# Patient Record
Sex: Female | Born: 1995 | Hispanic: Yes | Marital: Single | Smoking: Never smoker
Health system: Southern US, Community
[De-identification: ages and names within clinical notes are randomized; demographics above are authoritative.]

## PROBLEM LIST (undated history)

## (undated) ENCOUNTER — Inpatient Hospital Stay (HOSPITAL_COMMUNITY): Payer: Self-pay

## (undated) DIAGNOSIS — Z789 Other specified health status: Secondary | ICD-10-CM

## (undated) DIAGNOSIS — F1910 Other psychoactive substance abuse, uncomplicated: Secondary | ICD-10-CM

## (undated) DIAGNOSIS — F329 Major depressive disorder, single episode, unspecified: Secondary | ICD-10-CM

## (undated) DIAGNOSIS — F32A Depression, unspecified: Secondary | ICD-10-CM

## (undated) HISTORY — PX: NO PAST SURGERIES: SHX2092

---
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
## 2016-01-08 ENCOUNTER — Inpatient Hospital Stay (HOSPITAL_COMMUNITY)
Admission: AD | Admit: 2016-01-08 | Discharge: 2016-01-09 | Disposition: A | Discharge status: Home / Self Care | Payer: Self-pay | Source: Ambulatory Visit | Attending: Obstetrics & Gynecology | Admitting: Obstetrics & Gynecology

## 2016-01-08 ENCOUNTER — Encounter (HOSPITAL_COMMUNITY): Payer: Self-pay

## 2016-01-08 DIAGNOSIS — O219 Vomiting of pregnancy, unspecified: Secondary | ICD-10-CM | POA: Insufficient documentation

## 2016-01-08 DIAGNOSIS — Z3A00 Weeks of gestation of pregnancy not specified: Secondary | ICD-10-CM | POA: Insufficient documentation

## 2016-01-08 DIAGNOSIS — Z87891 Personal history of nicotine dependence: Secondary | ICD-10-CM | POA: Insufficient documentation

## 2016-01-08 LAB — URINALYSIS, ROUTINE W REFLEX MICROSCOPIC
Bilirubin Urine: NEGATIVE
Glucose, UA: NEGATIVE mg/dL
Hgb urine dipstick: NEGATIVE
Ketones, ur: 15 mg/dL — AB
LEUKOCYTES UA: NEGATIVE
NITRITE: NEGATIVE
Protein, ur: NEGATIVE mg/dL
SPECIFIC GRAVITY, URINE: 1.02 (ref 1.005–1.030)
pH: 6.5 (ref 5.0–8.0)

## 2016-01-08 LAB — CBC WITH DIFFERENTIAL/PLATELET
Basophils Absolute: 0 10*3/uL (ref 0.0–0.1)
Basophils Relative: 0 %
EOS ABS: 0 10*3/uL (ref 0.0–0.7)
EOS PCT: 0 %
HCT: 37.6 % (ref 36.0–46.0)
HEMOGLOBIN: 13.4 g/dL (ref 12.0–15.0)
LYMPHS ABS: 2.8 10*3/uL (ref 0.7–4.0)
LYMPHS PCT: 20 %
MCH: 32.4 pg (ref 26.0–34.0)
MCHC: 35.6 g/dL (ref 30.0–36.0)
MCV: 90.8 fL (ref 78.0–100.0)
MONOS PCT: 4 %
Monocytes Absolute: 0.5 10*3/uL (ref 0.1–1.0)
NEUTROS PCT: 76 %
Neutro Abs: 10.6 10*3/uL — ABNORMAL HIGH (ref 1.7–7.7)
Platelets: 396 10*3/uL (ref 150–400)
RBC: 4.14 MIL/uL (ref 3.87–5.11)
RDW: 12.3 % (ref 11.5–15.5)
WBC: 14 10*3/uL — ABNORMAL HIGH (ref 4.0–10.5)

## 2016-01-08 LAB — COMPREHENSIVE METABOLIC PANEL
ALT: 23 U/L (ref 14–54)
AST: 24 U/L (ref 15–41)
Albumin: 4.7 g/dL (ref 3.5–5.0)
Alkaline Phosphatase: 63 U/L (ref 38–126)
Anion gap: 10 (ref 5–15)
BUN: 7 mg/dL (ref 6–20)
CHLORIDE: 102 mmol/L (ref 101–111)
CO2: 23 mmol/L (ref 22–32)
CREATININE: 0.48 mg/dL (ref 0.44–1.00)
Calcium: 9.9 mg/dL (ref 8.9–10.3)
GFR calc Af Amer: >60 mL/min (ref >60)
GFR calc non Af Amer: >60 mL/min (ref >60)
Glucose, Bld: 91 mg/dL (ref 65–99)
Potassium: 3.6 mmol/L (ref 3.5–5.1)
SODIUM: 135 mmol/L (ref 135–145)
Total Bilirubin: 0.4 mg/dL (ref 0.3–1.2)
Total Protein: 8.2 g/dL — ABNORMAL HIGH (ref 6.5–8.1)

## 2016-01-08 LAB — LIPASE, BLOOD: LIPASE: 18 U/L (ref 11–51)

## 2016-01-08 LAB — POCT PREGNANCY, URINE: PREG TEST UR: POSITIVE — AB

## 2016-01-08 MED ORDER — PROCHLORPERAZINE EDISYLATE 5 MG/ML IJ SOLN
10.0000 mg | Freq: Once | INTRAMUSCULAR | Status: AC
  Administered 2016-01-09: 10 mg via INTRAVENOUS
  Filled 2016-01-08: qty 2

## 2016-01-08 MED ORDER — LACTATED RINGERS IV BOLUS (SEPSIS)
1000.0000 mL | Freq: Once | INTRAVENOUS | Status: AC
  Administered 2016-01-09: 1000 mL via INTRAVENOUS

## 2016-01-08 MED ORDER — PROMETHAZINE HCL 25 MG PO TABS
25.0000 mg | ORAL_TABLET | Freq: Once | ORAL | Status: AC
  Administered 2016-01-08: 25 mg via ORAL
  Filled 2016-01-08: qty 1

## 2016-01-08 NOTE — MAU Note (Signed)
N/V for 2 wks. Shortness of breath today. "I see everything dark" and then I have trouble breathing. HAve cold sweats when that happens. Some white vag d/c. No bleeding.

## 2016-01-08 NOTE — MAU Provider Note (Signed)
Chief Complaint: Nausea; Emesis During Pregnancy; and Shortness of Breath   SUBJECTIVE HPI: Norma Hardin is a 20 y.o. G2P0020 at Unknown who presents to [facility] reporting nausea, vomiting, short of breath, near syncope.  Symptoms began 2 weeks ago with nausea and vomiting. 4 times/day vomiting, every time she eats she is vomiting. She will also wake up in the night to vomit twice.  Unable to eat or drink for past two weeks, even water. Denies blood in vomit. +stooling. +urinating. Unsure if losing weight. Has tried The [employer]-WilliamsEmetrol OTC which only helped her fall sleep, but did not help with the nausea/vomiting.  Occasional stomach pain/cramping but not now. Denies vaginal bleeding or discharge.  LMP: sometime in October. But took a pregnancy test before which showed positive.       Past Medical History:  Diagnosis Date  . Medical history non-contributory    OB History  Gravida Para Term Preterm AB Living  2       2 0  SAB TAB Ectopic Multiple Live Births  2            # Outcome Date GA Lbr Len/2nd Weight Sex Delivery Anes PTL Lv  2 SAB           1 SAB              Past Surgical History:  Procedure Laterality Date  . NO PAST SURGERIES     Social History   Social History  . Marital status: Single    Spouse name: N/A  . Number of children: N/A  . Years of education: N/A   Occupational History  . Not on file.   Social History Main Topics  . Smoking status: Former Smoker    Quit date: 12/12/2013  . Smokeless tobacco: Never Used  . Alcohol use 0.6 oz/week    1 Cans of beer per week     Comment: Stopped drinking 1 months ago. Patient drank   . Drug use: No     Comment: Patient quit 3 months ago. Has taken several times.    NONE SINCE 6 MTHS  . Sexual activity: Yes    Birth control/ protection: None   Other Topics Concern  . Not on file   Social History Narrative  . No narrative on file   No current facility-administered medications on file prior to  encounter.    Current Outpatient Prescriptions on File Prior to Encounter  Medication Sig Dispense Refill  . cephALEXin (KEFLEX) 500 MG capsule Take 1 capsule (500 mg total) by mouth 4 (four) times daily. (Patient not taking: Reported on 07/14/2015) 28 capsule 0  . folic acid (FOLVITE) 1 MG tablet Take 1 mg by mouth daily.    Marland Kitchen. ibuprofen (ADVIL,MOTRIN) 600 MG tablet Take 1 tablet (600 mg total) by mouth every 6 (six) hours as needed. 30 tablet 0  . misoprostol (CYTOTEC) 200 MCG tablet Take 3 tablets (600 mcg total) by mouth once. Place medication in the side of your cheeks and let it dissolve 3 tablet 0  . oxyCODONE-acetaminophen (PERCOCET/ROXICET) 5-325 MG tablet Take 1-2 tablets by mouth every 6 (six) hours as needed. (Patient not taking: Reported on 07/14/2015) 10 tablet 0  . oxyCODONE-acetaminophen (PERCOCET/ROXICET) 5-325 MG tablet Take 1-2 tablets by mouth every 4 (four) hours as needed for severe pain. 10 tablet 0  . Prenatal Vit-Fe Fumarate-FA (PRENATAL MULTIVITAMIN) TABS tablet Take 1 tablet by mouth daily at 12 noon.     No Known Allergies  I have reviewed the past Medical Hx, Surgical Hx, Social Hx, Allergies and Medications.   REVIEW OF SYSTEMS  A comprehensive ROS was negative except per HPI.    OBJECTIVE Patient Vitals for the past 24 hrs:  BP Temp Pulse Resp SpO2 Height Weight  01/08/16 2157 (!) 120/45 98.3 F (36.8 C) 87 18 100 % 5\' 1"  (1.549 m) 129 lb 3.2 oz (58.6 kg)    PHYSICAL EXAM Constitutional: Well-developed, well-nourished female in no acute distress.  Cardiovascular: normal rate, rhythm, no murmurs Respiratory: normal rate and effort, CTAB  GI: Abd soft, non-tender, non-distended. Pos BS x 4 MS: Extremities nontender, no edema, normal ROM Neurologic: Alert and oriented x 4.  GU: Neg CVAT.  Bedside US performed, confirmed IUP. Limited by early gestation could not perform dating, deferred to outpatient OB visit.   LAB RESULTS Results for orders placed or  performed during the hospital encounter of 01/08/16 (from the past 24 hour(s))  Pregnancy, urine POC     Status: Abnormal   Collection Time: 01/08/16 10:33 PM  Result Value Ref Range   Preg Test, Ur POSITIVE (A) NEGATIVE    IMAGING No results found.  MAU COURSE IVF Phenergan/Compazine Labs - WNL UA - WNL PO trial - passed Bedside US confirmed IUP, limited by early gestational age  MDM Plan of care reviewed with patient, including labs and tests ordered and medical treatment.   ASSESSMENT 1. Nausea/vomiting in pregnancy     PLAN Discharge home in stable condition. Bleeding precautions given.  Medications for N/V sent to pharmacy Follow up with OB provider soon      Medication List    STOP taking these medications   cephALEXin 500 MG capsule Commonly known as:  KEFLEX   ibuprofen 600 MG tablet Commonly known as:  ADVIL,MOTRIN   misoprostol 200 MCG tablet Commonly known as:  CYTOTEC   oxyCODONE-acetaminophen 5-325 MG tablet Commonly known as:  PERCOCET/ROXICET     TAKE these medications   folic acid 1 MG tablet Commonly known as:  FOLVITE Take 1 mg by mouth daily.   prenatal multivitamin Tabs tablet Take 1 tablet by mouth daily at 12 noon.   promethazine 12.5 MG tablet Commonly known as:  PHENERGAN Take 1 tablet (12.5 mg total) by mouth every 6 (six) hours as needed for nausea or vomiting. Can take two tablets if needed.        Jen MowElizabeth Mumaw, DO OB Fellow 01/08/2016 10:38 PM

## 2016-01-09 DIAGNOSIS — O219 Vomiting of pregnancy, unspecified: Secondary | ICD-10-CM

## 2016-01-09 LAB — HCG, QUANTITATIVE, PREGNANCY: hCG, Beta Chain, Quant, S: 101979 m[IU]/mL — ABNORMAL HIGH (ref <5)

## 2016-01-09 MED ORDER — PROMETHAZINE HCL 12.5 MG PO TABS: 12.5000 mg | ORAL_TABLET | Freq: Four times a day (QID) | ORAL | 0 refills | Status: DC | PRN

## 2016-01-09 NOTE — Discharge Instructions (Signed)
Primer trimestre de embarazo °(First Trimester of Pregnancy) °El primer trimestre de embarazo se extiende desde la semana 1 hasta el final de la semana 12 (mes 1 al mes 3). Durante este tiempo, el bebé comenzará a desarrollarse dentro suyo. Entre la semana 6 y la 8, se forman los ojos y el rostro, y los latidos del corazón pueden verse en la ecografía. Al final de las 12 semanas, todos los órganos del bebé están formados. La atención prenatal es toda la asistencia médica que usted recibe antes del nacimiento del bebé. Asegúrese de recibir una buena atención prenatal y de seguir todas las indicaciones del médico. °CUIDADOS EN EL HOGAR °Medicamentos: °· Tome los medicamentos solamente como se lo haya indicado el médico. Algunos medicamentos se pueden tomar durante el embarazo y otros no. °· Tome las vitaminas prenatales como se lo haya indicado el médico. °· Tome el medicamento que la ayuda a defecar (laxante suave) según sea necesario, si el médico lo autoriza. °Dieta °· Ingiera alimentos saludables de manera regular. °· El médico le indicará la cantidad de peso que puede aumentar. °· No coma carne cruda ni quesos sin cocinar. °· Si tiene malestar estomacal (náuseas) o vomita: °? Ingiera 4 o 5 comidas pequeñas por día en lugar de 3 abundantes. °? Intente comer algunas galletitas saladas. °? Beba líquidos entre las comidas, en lugar de hacerlo durante estas. °· Si tiene dificultad para defecar (estreñimiento): °? Consuma alimentos con alto contenido de fibra, como verduras y frutas frescos, y cereales integrales. °? Beba suficiente líquido para mantener el pis (orina) claro o de color amarillo pálido. °Actividad y ejercicios °· Haga ejercicios solamente como se lo haya indicado el médico. Deje de hacer ejercicios si tiene cólicos o dolor en la parte baja del vientre (abdomen) o en la cintura. °· Intente no estar de pie durante mucho tiempo. Mueva las piernas con frecuencia si debe estar de pie en un lugar durante  mucho tiempo. °· Evite levantar pesos excesivos. °· Use zapatos con tacones bajos. Mantenga una buena postura al sentarse y pararse. °· Puede tener relaciones sexuales, a menos que el médico le indique lo contrario. °Alivio del dolor o las molestias °· Use un sostén que le brinde buen soporte si le duelen las mamas. °· Dese baños con agua tibia (baños de asiento) para aliviar el dolor o las molestias a causa de las hemorroides. Use crema antihemorroidal si el médico se lo permite. °· Descanse con las piernas elevadas si tiene calambres o dolor de cintura. °· Use medias de descanso si tiene las venas de las piernas hinchadas y abultadas (venas varicosas). Eleve los pies durante 15 minutos, 3 o 4 veces por día. Limite la cantidad de sal en su dieta. °Cuidados prenatales °· Programe las visitas prenatales para la semana 12 de embarazo. °· Escriba sus preguntas. Llévelas cuando concurra a las visitas prenatales. °· Concurra a todas las visitas prenatales como se lo haya indicado el médico. °Seguridad °· Colóquese el cinturón de seguridad cuando conduzca. °· Haga una lista con los números de teléfono en caso de emergencia, en la cual deben incluirse los números de los familiares, los amigos, el hospital y los departamentos de policía y de bomberos. °Consejos generales °· Pídale al médico que la derive a clases prenatales en su localidad. Debe comenzar a tomar las clases antes de entrar en el mes 6 de embarazo. °· Pida ayuda si necesita asesoramiento o asistencia con la alimentación. El médico puede aconsejarla o indicarle dónde recurrir para recibir ayuda. °· No se dé baños de inmersión en agua caliente, baños   turcos ni saunas. °· No se haga duchas vaginales ni use tampones o toallas higiénicas perfumadas. °· No mantenga las piernas cruzadas durante mucho tiempo. °· Evite el contacto con las bandejas sanitarias de los gatos y la tierra que estos animales usan. °· No fume, no consuma hierbas ni beba alcohol. No tome  fármacos que el médico no haya autorizado. °· No consuma ningún producto que contenga tabaco, lo que incluye cigarrillos, tabaco de mascar o cigarrillos electrónicos. Si necesita ayuda para dejar de fumar, consulte al médico. Puede recibir asesoramiento u otro tipo de apoyo para dejar de fumar. °· Visite al dentista. En su casa, lávese los dientes con un cepillo dental suave. Pásese el hilo dental con suavidad. °SOLICITE AYUDA SI: °· Tiene mareos. °· Tiene cólicos leves o siente presión en la parte baja del vientre. °· Siente un dolor persistente en la zona del vientre. °· Sigue teniendo malestar estomacal, vomita o las heces son líquidas (diarrea). °· Observa una secreción, con mal olor que proviene de la vagina. °· Siente dolor al orinar. °· Tiene el rostro, las manos, las piernas o los tobillos más hinchados (inflamados). ° °SOLICITE AYUDA DE INMEDIATO SI: °· Tiene fiebre. °· Tiene una pérdida de líquido por la vagina. °· Tiene sangrado o pequeñas pérdidas vaginales. °· Tiene cólicos o dolor muy intensos en el vientre. °· Sube o baja de peso rápidamente. °· Vomita sangre. Puede ser similar a la borra del café °· Está en contacto con personas que tienen rubéola, la quinta enfermedad o varicela. °· Siente un dolor de cabeza muy intenso. °· Le falta el aire. °· Sufre cualquier tipo de traumatismo, por ejemplo, debido a una caída o un accidente automovilístico. ° °Esta información no tiene como fin reemplazar el consejo del médico. Asegúrese de hacerle al médico cualquier pregunta que tenga. °Document Released: 04/29/2008 Document Revised: 02/21/2014 Document Reviewed: 12/11/2012 °Elsevier Interactive Patient Education © 2017 [employer] ° °

## 2016-01-31 ENCOUNTER — Encounter (HOSPITAL_COMMUNITY): Payer: Self-pay | Admitting: *Deleted

## 2016-01-31 ENCOUNTER — Inpatient Hospital Stay (HOSPITAL_COMMUNITY): Payer: Self-pay

## 2016-01-31 ENCOUNTER — Inpatient Hospital Stay (HOSPITAL_COMMUNITY)
Admission: AD | Admit: 2016-01-31 | Discharge: 2016-01-31 | Disposition: A | Discharge status: Home / Self Care | Payer: Self-pay | Source: Ambulatory Visit | Attending: Obstetrics and Gynecology | Admitting: Obstetrics and Gynecology

## 2016-01-31 DIAGNOSIS — B373 Candidiasis of vulva and vagina: Secondary | ICD-10-CM | POA: Insufficient documentation

## 2016-01-31 DIAGNOSIS — Z3A12 12 weeks gestation of pregnancy: Secondary | ICD-10-CM | POA: Insufficient documentation

## 2016-01-31 DIAGNOSIS — O23591 Infection of other part of genital tract in pregnancy, first trimester: Secondary | ICD-10-CM | POA: Insufficient documentation

## 2016-01-31 DIAGNOSIS — Z87891 Personal history of nicotine dependence: Secondary | ICD-10-CM | POA: Insufficient documentation

## 2016-01-31 DIAGNOSIS — R109 Unspecified abdominal pain: Secondary | ICD-10-CM

## 2016-01-31 DIAGNOSIS — N760 Acute vaginitis: Secondary | ICD-10-CM

## 2016-01-31 DIAGNOSIS — O98811 Other maternal infectious and parasitic diseases complicating pregnancy, first trimester: Secondary | ICD-10-CM | POA: Insufficient documentation

## 2016-01-31 DIAGNOSIS — B9689 Other specified bacterial agents as the cause of diseases classified elsewhere: Secondary | ICD-10-CM | POA: Insufficient documentation

## 2016-01-31 DIAGNOSIS — O26899 Other specified pregnancy related conditions, unspecified trimester: Secondary | ICD-10-CM

## 2016-01-31 DIAGNOSIS — B379 Candidiasis, unspecified: Secondary | ICD-10-CM

## 2016-01-31 LAB — CBC
HEMATOCRIT: 32.3 % — AB (ref 36.0–46.0)
Hemoglobin: 11.4 g/dL — ABNORMAL LOW (ref 12.0–15.0)
MCH: 32.2 pg (ref 26.0–34.0)
MCHC: 35.3 g/dL (ref 30.0–36.0)
MCV: 91.2 fL (ref 78.0–100.0)
Platelets: 368 10*3/uL (ref 150–400)
RBC: 3.54 MIL/uL — AB (ref 3.87–5.11)
RDW: 12.6 % (ref 11.5–15.5)
WBC: 12 10*3/uL — AB (ref 4.0–10.5)

## 2016-01-31 LAB — URINALYSIS, ROUTINE W REFLEX MICROSCOPIC
BILIRUBIN URINE: NEGATIVE
GLUCOSE, UA: NEGATIVE mg/dL
Hgb urine dipstick: NEGATIVE
Ketones, ur: NEGATIVE mg/dL
Leukocytes, UA: NEGATIVE
NITRITE: NEGATIVE
PH: 7 (ref 5.0–8.0)
Protein, ur: NEGATIVE mg/dL
SPECIFIC GRAVITY, URINE: 1.018 (ref 1.005–1.030)

## 2016-01-31 LAB — WET PREP, GENITAL
Sperm: NONE SEEN
Trich, Wet Prep: NONE SEEN

## 2016-01-31 LAB — HCG, QUANTITATIVE, PREGNANCY: hCG, Beta Chain, Quant, S: 125931 m[IU]/mL — ABNORMAL HIGH (ref <5)

## 2016-01-31 MED ORDER — METRONIDAZOLE 500 MG PO TABS: 500.0000 mg | ORAL_TABLET | Freq: Three times a day (TID) | ORAL | 0 refills | Status: AC

## 2016-01-31 MED ORDER — TERCONAZOLE 0.4 % VA CREA: 1.0000 | TOPICAL_CREAM | Freq: Every day | VAGINAL | 0 refills | Status: DC

## 2016-01-31 NOTE — MAU Provider Note (Signed)
History   Patient is a 20 year old G3P0020 at 12 weeks here with complaints of abdominal pain that started this morning.   CSN: [id number]  Arrival date and time: 01/31/16 1635   First Provider Initiated Contact with Patient 01/31/16 1929      Chief Complaint  Patient presents with  . Abdominal Pain   Abdominal Pain  This is a new problem. The current episode started today. The onset quality is sudden. The problem occurs intermittently. The problem has been unchanged. The pain is located in the suprapubic region. The pain is at a severity of 4/10. The pain is moderate. The quality of the pain is cramping. The abdominal pain does not radiate. Pertinent negatives include no anorexia, arthralgias, belching, constipation, diarrhea, dysuria, fever, flatus, frequency, headaches, hematochezia, hematuria, melena, myalgias, nausea, vomiting or weight loss. Nothing aggravates the pain. The pain is relieved by nothing. She has tried nothing for the symptoms.    OB History    Gravida Para Term Preterm AB Living   3       2 0   SAB TAB Ectopic Multiple Live Births   2              Past Medical History:  Diagnosis Date  . Medical history non-contributory     Past Surgical History:  Procedure Laterality Date  . NO PAST SURGERIES      No family history on file.  Social History  Substance Use Topics  . Smoking status: Former Smoker    Quit date: 12/12/2013  . Smokeless tobacco: Never Used  . Alcohol use 0.6 oz/week    1 Cans of beer per week     Comment: Stopped drinking 1 months ago. Patient drank     Allergies: No Known Allergies  Prescriptions Prior to Admission  Medication Sig Dispense Refill Last Dose  . folic acid (FOLVITE) 1 MG tablet Take 1 mg by mouth daily.   07/09/2015  . Prenatal Vit-Fe Fumarate-FA (PRENATAL MULTIVITAMIN) TABS tablet Take 1 tablet by mouth daily at 12 noon.   07/29/2015 at Unknown time  . promethazine (PHENERGAN) 12.5 MG tablet Take 1 tablet (12.5 mg  total) by mouth every 6 (six) hours as needed for nausea or vomiting. Can take two tablets if needed. 30 tablet 0     Review of Systems  Constitutional: Negative for fever and weight loss.  HENT: Negative.   Eyes: Negative.   Respiratory: Negative.   Cardiovascular: Negative.   Gastrointestinal: Positive for abdominal pain. Negative for anorexia, constipation, diarrhea, flatus, hematochezia, melena, nausea and vomiting.  Genitourinary: Negative for dysuria, frequency and hematuria.  Musculoskeletal: Negative for arthralgias and myalgias.  Skin: Negative.   Neurological: Negative.  Negative for headaches.  Endo/Heme/Allergies: Negative.   Psychiatric/Behavioral: Negative.    Physical Exam   Blood pressure 108/64, pulse 86, temperature 98.3 F (36.8 C), temperature source Oral, resp. rate 16, weight 58 kg (127 lb 12.8 oz), last menstrual period 11/26/2015, unknown if currently breastfeeding.  Physical Exam  Constitutional: She appears well-developed and well-nourished.  HENT:  Head: Normocephalic.  Eyes: Pupils are equal, round, and reactive to light.  Neck: Normal range of motion.  Respiratory: Effort normal. No respiratory distress. She has no wheezes. She has no rales. She exhibits no tenderness.  GI: Soft. Bowel sounds are normal. She exhibits no distension and no mass. There is no tenderness. There is no rebound and no guarding.  Genitourinary: Vagina normal.  Genitourinary Comments: External labia normal, no  lesions. Speculum exam reveals erythematous vaginal walls with adherant clumpy white discharge. Cervix is pink with no lesions. No CMT.     MAU Course  Procedures  MDM -bimanual and spec -wet prep: yeast and clue cells present -GC CT pending -beta is 125,000 -US-normal IUP at 12 weeks  Assessment and Plan  1. G3 P0020 at 12 weeks with bacterial vaginosis and yeast infection.  2. Discharge home with Flagyl and terazol; medication instructions given.  3. Patient  will follow up with the health department to begin her prenatal care.   Luna KitchensKathryn Neriyah Cercone CNM  Charlesetta GaribaldiKathryn Lorraine Juanita Streight 01/31/2016, 7:36 PM

## 2016-01-31 NOTE — Discharge Instructions (Signed)
Candidiasis vaginal en los adultos (Gastrointestinal Yeast Infection, Adult) La candidiasis vaginal es una afeccin que causa dolor, hinchazn y enrojecimiento (inflamacin) de la vagina. Tambin causa secrecin vaginal. Esta es una enfermedad frecuente. Algunas mujeres contraen esta infeccin con frecuencia. CAUSAS La causa de la infeccin es un cambio en el equilibrio normal de los hongos (cndida) y las bacterias que viven en la vagina. Esta alteracin deriva en el crecimiento excesivo de los hongos, lo que causa la inflamacin. FACTORES DE RIESGO Es ms probable que esta afeccin se manifieste en:  Las mujeres que toman antibiticos.  Las mujeres que tienen diabetes.  Las mujeres que toman anticonceptivos.  Las mujeres que estn embarazadas.  Las mujeres que se hacen duchas vaginales con frecuencia.  Las mujeres que tienen un sistema de defensa (inmunitario) dbil.  Las mujeres que han tomado corticoides durante mucho tiempo.  Las mujeres que usan ropa ajustada con frecuencia. SNTOMAS Los sntomas de esta afeccin incluyen lo siguiente:  Secrecin vaginal blanca y espesa.  Hinchazn, picazn, enrojecimiento e irritacin de la vagina. Los labios de la vagina (vulva) tambin se pueden infectar.  Dolor o ardor al Geographical information systems officerorinar.  Dolor durante las The St. Paul Travelersrelaciones sexuales. DIAGNSTICO Esta afeccin se diagnostica mediante la historia clnica y un examen fsico. Este incluye un examen plvico. El mdico examinar una muestra de la secrecin vaginal con un microscopio. Probablemente el mdico enve esta muestra al laboratorio para analizarla y confirmar el diagnstico. TRATAMIENTO Esta afeccin se trata con medicamentos. Los United Parcelmedicamentos pueden ser recetados o de [address] lib[address]. Podrn indicarle que use uno o ms de lo siguiente:  Medicamentos por va oral.  Medicamentos que se aplican como una crema.  Medicamentos que se colocan directamente en la vagina (vulos vaginales). INSTRUCCIONES  PARA EL CUIDADO EN EL HOGAR  Tome o aplquese los medicamentos de venta libre y Building control surveyorrecetados solamente como se lo haya indicado el mdico.  No tenga relaciones sexuales hasta que el mdico lo autorice. Comunique a su compaero sexual que tiene una infeccin por hongos. Esas personas deben consultar al mdico si tienen sntomas.  No use ropa ajustada, como pantis o pantalones ajustados.  Evite el uso de tampones hasta que el mdico lo autorice.  Consuma ms yogur. Esto puede ayudar a Artistevitar la recurrencia de la candidiasis.  Intente darse un bao de asiento para Altria Groupaliviar las molestias. Se trata de un bao de agua tibia que se toma mientras se est sentado. El agua solo debe Adult nursellegar hasta las caderas y cubrir las nalgas. Hgalo 3o 4veces al da o como se lo haya indicado el mdico.  No se haga duchas vaginales.  Use ropa interior transpirable de algodn.  Si tiene diabetes, mantenga bajo control los niveles de Bankerazcar en la sangre. SOLICITE ATENCIN MDICA SI:  Lance Mussiene fiebre.  Los sntomas desaparecen y Stage managerluego reaparecen.  Los sntomas no mejoran con Scientist, research (medical)el tratamiento.  Los sntomas empeoran.  Aparecen nuevos sntomas.  Aparecen ampollas alrededor o adentro de la vagina.  Le sale sangre de la vagina y no est menstruando.  Siente dolor en el abdomen. Esta informacin no tiene Theme park managercomo fin reemplazar el consejo del mdico. Asegrese de hacerle al mdico cualquier pregunta que tenga. Document Released: 11/10/2004 Document Revised: 05/25/2015 Document Reviewed: 08/04/2014 Elsevier Interactive Patient Education  2017 Elsevier Inc. Vaginosis bacteriana (Bacterial Vaginosis) La vaginosis bacteriana es una infeccin de la vagina. Se produce cuando crece una cantidad excesiva de grmenes normales (bacterias sanas) en la vagina. Esta infeccin aumenta el riesgo de contraer otras infecciones de  transmisin sexual. El tratamiento de esta infeccin puede ayudar a reducir el riesgo de otras  infecciones, como:  Clamidia.  Bettey MareGonorrea.  VIH.  Herpes. CUIDADOS EN EL HOGAR  Tome los medicamentos tal como se lo indic su mdico.  Finalice la prescripcin completa, aunque comience a sentirse mejor.  Comunique a sus compaeros sexuales que sufre una infeccin. Deben consultar a su mdico para iniciar un tratamiento.  Durante el tratamiento:  Soil scientistvite mantener relaciones sexuales o use preservativos de Network engineerla forma correcta.  No se haga duchas vaginales.  No consuma alcohol a menos que el mdico lo autorice.  No amamante a menos que el mdico la autorice. SOLICITE AYUDA SI:  No mejora luego de 3 das de [place] Janettratamiento.  Observa una secrecin (prdida) de color gris ms abundante que proviene de la vagina.  Siente ms dolor que antes.  Tiene fiebre. ASEGRESE DE QUE:  Comprende estas instrucciones.  Controlar su afeccin.  Recibir ayuda de inmediato si no mejora o si empeora. Esta informacin no tiene Theme park managercomo fin reemplazar el consejo del mdico. Asegrese de hacerle al mdico cualquier pregunta que tenga. Document Released: 04/29/2008 Document Revised: 05/25/2015 Document Reviewed: 09/12/2012 Elsevier Interactive Patient Education  2017 [employer] Inc.

## 2016-01-31 NOTE — MAU Note (Signed)
Pain in lower, started this morning.  Comes and goes, like period cramping

## 2016-02-01 LAB — HIV ANTIBODY (ROUTINE TESTING W REFLEX): HIV SCREEN 4TH GENERATION: NONREACTIVE

## 2016-02-01 LAB — GC/CHLAMYDIA PROBE AMP (~~LOC~~) NOT AT ARMC
Chlamydia: NEGATIVE
NEISSERIA GONORRHEA: NEGATIVE

## 2016-02-15 NOTE — L&D Delivery Note (Signed)
Delivery Note At 10:01 PM a viable female was delivered via Vaginal, Spontaneous Delivery (Presentation: OA).  Delayed cord clamping performed. APGAR: 9, 9; weight  pending. Placenta status: delivered, intact. Cord: 3V with the following complications: none. Cord pH: n/a  Anesthesia: local  Episiotomy: None Lacerations: 2nd degree;Perineal Suture Repair: 2.0 vicryl rapide Est. Blood Loss (mL): 300  Mom to postpartum.  Baby to Couplet care / Skin to Skin.  Donette LarryMelanie Denisia Harpole, CNM 08/21/2016, 10:40 PM

## 2016-03-01 LAB — OB RESULTS CONSOLE ANTIBODY SCREEN: Antibody Screen: NEGATIVE

## 2016-03-01 LAB — OB RESULTS CONSOLE GC/CHLAMYDIA
Chlamydia: NEGATIVE
GC PROBE AMP, GENITAL: NEGATIVE

## 2016-03-01 LAB — OB RESULTS CONSOLE ABO/RH: RH Type: POSITIVE

## 2016-03-01 LAB — OB RESULTS CONSOLE HIV ANTIBODY (ROUTINE TESTING): HIV: NONREACTIVE

## 2016-03-01 LAB — OB RESULTS CONSOLE RUBELLA ANTIBODY, IGM: Rubella: IMMUNE

## 2016-03-01 LAB — OB RESULTS CONSOLE RPR: RPR: NONREACTIVE

## 2016-03-01 LAB — OB RESULTS CONSOLE HEPATITIS B SURFACE ANTIGEN: Hepatitis B Surface Ag: NEGATIVE

## 2016-03-27 ENCOUNTER — Inpatient Hospital Stay (HOSPITAL_COMMUNITY)
Admission: AD | Admit: 2016-03-27 | Discharge: 2016-03-27 | Disposition: A | Discharge status: Home / Self Care | Payer: Medicaid Other | Source: Ambulatory Visit | Attending: Obstetrics & Gynecology | Admitting: Obstetrics & Gynecology

## 2016-03-27 DIAGNOSIS — Z3A20 20 weeks gestation of pregnancy: Secondary | ICD-10-CM | POA: Insufficient documentation

## 2016-03-27 DIAGNOSIS — K117 Disturbances of salivary secretion: Secondary | ICD-10-CM | POA: Insufficient documentation

## 2016-03-27 DIAGNOSIS — O219 Vomiting of pregnancy, unspecified: Secondary | ICD-10-CM | POA: Insufficient documentation

## 2016-03-27 DIAGNOSIS — O26899 Other specified pregnancy related conditions, unspecified trimester: Secondary | ICD-10-CM

## 2016-03-27 DIAGNOSIS — B373 Candidiasis of vulva and vagina: Secondary | ICD-10-CM

## 2016-03-27 DIAGNOSIS — B3731 Acute candidiasis of vulva and vagina: Secondary | ICD-10-CM

## 2016-03-27 DIAGNOSIS — R109 Unspecified abdominal pain: Secondary | ICD-10-CM | POA: Diagnosis not present

## 2016-03-27 DIAGNOSIS — Z87891 Personal history of nicotine dependence: Secondary | ICD-10-CM | POA: Insufficient documentation

## 2016-03-27 DIAGNOSIS — O98812 Other maternal infectious and parasitic diseases complicating pregnancy, second trimester: Secondary | ICD-10-CM | POA: Insufficient documentation

## 2016-03-27 DIAGNOSIS — O26892 Other specified pregnancy related conditions, second trimester: Secondary | ICD-10-CM | POA: Insufficient documentation

## 2016-03-27 DIAGNOSIS — Z79899 Other long term (current) drug therapy: Secondary | ICD-10-CM | POA: Insufficient documentation

## 2016-03-27 LAB — URINALYSIS, ROUTINE W REFLEX MICROSCOPIC
Bilirubin Urine: NEGATIVE
Glucose, UA: NEGATIVE mg/dL
Hgb urine dipstick: NEGATIVE
Ketones, ur: NEGATIVE mg/dL
Leukocytes, UA: NEGATIVE
Nitrite: NEGATIVE
Protein, ur: NEGATIVE mg/dL
Specific Gravity, Urine: 1.01 (ref 1.005–1.030)
pH: 8 (ref 5.0–8.0)

## 2016-03-27 LAB — WET PREP, GENITAL
CLUE CELLS WET PREP: NONE SEEN
Sperm: NONE SEEN
TRICH WET PREP: NONE SEEN

## 2016-03-27 MED ORDER — TERCONAZOLE 0.4 % VA CREA: 1.0000 | TOPICAL_CREAM | Freq: Every day | VAGINAL | 0 refills | Status: DC

## 2016-03-27 MED ORDER — GLYCOPYRROLATE 1 MG PO TABS: 1.0000 mg | ORAL_TABLET | Freq: Three times a day (TID) | ORAL | 0 refills | Status: DC | PRN

## 2016-03-27 MED ORDER — ACETAMINOPHEN 500 MG PO TABS
1000.0000 mg | ORAL_TABLET | Freq: Four times a day (QID) | ORAL | Status: DC | PRN
  Administered 2016-03-27: 1000 mg via ORAL
  Filled 2016-03-27: qty 2

## 2016-03-27 NOTE — MAU Provider Note (Signed)
History     CSN: [id number]  Arrival date and time: 03/27/16 1739   First Provider Initiated Contact with Patient 03/27/16 1802    In-house interpreter present for encounter  Chief Complaint  Patient presents with  . Abdominal Pain   G3P0020 @20 .0 weeks here with constant lower abdominal cramping. She reports sx started 4 hrs ago. She rates the pain 8/10. She has not used anything for the pain. She also reports malodor from the vagina yesterday and thick yellow vaginal discharge today. No VB. No recent IC. She denies fevers. No urinary sx. She admits to poor hydration today. She reports she was in a verbal altercation with her boyfriend before the pain started. She denies physical abuse from partner, they live together, and feels safe in her home. She is receiving care at [facility]. She reports N/V about every other day for which she uses Phenergan, and excessive saliva and spitting all day long.    OB History    Gravida Para Term Preterm AB Living   3       2 0   SAB TAB Ectopic Multiple Live Births   2              Past Medical History:  Diagnosis Date  . Medical history non-contributory     Past Surgical History:  Procedure Laterality Date  . NO PAST SURGERIES      No family history on file.  Social History  Substance Use Topics  . Smoking status: Former Smoker    Quit date: 12/12/2013  . Smokeless tobacco: Never Used  . Alcohol use 0.6 oz/week    1 Cans of beer per week     Comment: Stopped drinking 1 months ago. Patient drank     Allergies: No Known Allergies  Prescriptions Prior to Admission  Medication Sig Dispense Refill Last Dose  . folic acid (FOLVITE) 1 MG tablet Take 1 mg by mouth daily.   07/09/2015  . Prenatal Vit-Fe Fumarate-FA (PRENATAL MULTIVITAMIN) TABS tablet Take 1 tablet by mouth daily at 12 noon.   07/29/2015 at Unknown time  . promethazine (PHENERGAN) 12.5 MG tablet Take 1 tablet (12.5 mg total) by mouth every 6 (six) hours as needed for nausea or  vomiting. Can take two tablets if needed. 30 tablet 0   . terconazole (TERAZOL 7) 0.4 % vaginal cream Place 1 applicator vaginally at bedtime. 45 g 0     Review of Systems  Constitutional: Negative for chills and fever.  Gastrointestinal: Positive for abdominal pain, nausea and vomiting.  Genitourinary: Positive for vaginal discharge. Negative for dysuria, hematuria, urgency and vaginal bleeding.   Physical Exam   Blood pressure 111/63, pulse 108, temperature 99.1 F (37.3 C), temperature source Oral, resp. rate 16, last menstrual period 11/26/2015, unknown if currently breastfeeding.  Physical Exam  Nursing note and vitals reviewed. Constitutional: She is oriented to person, place, and time. She appears well-developed and well-nourished. No distress (eyes appear red, suspect recent crying).  HENT:  Head: Normocephalic and atraumatic.  Neck: Normal range of motion.  Respiratory: Effort normal.  GI: Soft. She exhibits no distension and no mass. There is tenderness (suprapubic). There is no rebound and no guarding.  Genitourinary:  Genitourinary Comments: External: no lesions or erythema Vagina: rugated, nulli, copious thick drk yellow curdy discharge SVE: closed/long   Musculoskeletal: Normal range of motion.  Neurological: She is alert and oriented to person, place, and time.  Skin: Skin is warm and dry.  Psychiatric: She  has a normal mood and affect.   FHT: 153 bpm  Results for orders placed or performed during the hospital encounter of 03/27/16 (from the past 24 hour(s))  Urinalysis, Routine w reflex microscopic     Status: None   Collection Time: 03/27/16  5:50 PM  Result Value Ref Range   Color, Urine YELLOW YELLOW   APPearance CLEAR CLEAR   Specific Gravity, Urine 1.010 1.005 - 1.030   pH 8.0 5.0 - 8.0   Glucose, UA NEGATIVE NEGATIVE mg/dL   Hgb urine dipstick NEGATIVE NEGATIVE   Bilirubin Urine NEGATIVE NEGATIVE   Ketones, ur NEGATIVE NEGATIVE mg/dL   Protein, ur  NEGATIVE NEGATIVE mg/dL   Nitrite NEGATIVE NEGATIVE   Leukocytes, UA NEGATIVE NEGATIVE  Wet prep, genital     Status: Abnormal   Collection Time: 03/27/16  6:20 PM  Result Value Ref Range   Yeast Wet Prep HPF POC PRESENT (A) NONE SEEN   Trich, Wet Prep NONE SEEN NONE SEEN   Clue Cells Wet Prep HPF POC NONE SEEN NONE SEEN   WBC, Wet Prep HPF POC MANY (A) NONE SEEN   Sperm NONE SEEN     MAU Course  Procedures Tylenol 1 g po Po hydration  MDM Labs ordered and reviewed. Pain improved from 8 to 4. No evidence of UTI or threatened Ab. Will treat for yeast and ptyalism. Stable for discharge home.   Assessment and Plan   1. [redacted] weeks gestation of pregnancy   2. Abdominal cramping affecting pregnancy   3. Ptyalism   4. Yeast vaginitis    [facility] Rx Teradol Rx Robinul Increase water intake  Allergies as of 03/27/2016   No Known Allergies     Medication List    TAKE these medications   folic acid 1 MG tablet Commonly known as:  FOLVITE Take 1 mg by mouth daily.   glycopyrrolate 1 MG tablet Commonly known as:  ROBINUL Take 1 tablet (1 mg total) by mouth 3 (three) times daily as needed.   prenatal multivitamin Tabs tablet Take 1 tablet by mouth daily at 12 noon.   promethazine 12.5 MG tablet Commonly known as:  PHENERGAN Take 1 tablet (12.5 mg total) by mouth every 6 (six) hours as needed for nausea or vomiting. Can take two tablets if needed.   terconazole 0.4 % vaginal cream Commonly known as:  TERAZOL 7 Place 1 applicator vaginally at bedtime. What changed:  Another medication with the same name was added. Make sure you understand how and when to take each.   terconazole 0.4 % vaginal cream Commonly known as:  TERAZOL 7 Place 1 applicator vaginally at bedtime. What changed:  You were already taking a medication with the same name, and this prescription was added. Make sure you understand how and when to take each.       Donette LarryMelanie Lounell Schumacher, CNM 03/27/2016, 6:24 PM

## 2016-03-27 NOTE — MAU Note (Signed)
Patient presents with lower abdominal cramping which started this afternoon, got in an argument with her boyfriend and started crying, this started after that, denies vaginal bleeding, small amount of discomfort with urination.

## 2016-03-27 NOTE — Discharge Instructions (Signed)

## 2016-03-28 LAB — GC/CHLAMYDIA PROBE AMP (~~LOC~~) NOT AT ARMC
CHLAMYDIA, DNA PROBE: NEGATIVE
Neisseria Gonorrhea: NEGATIVE

## 2016-07-04 LAB — OB RESULTS CONSOLE GBS: STREP GROUP B AG: NEGATIVE

## 2016-08-16 ENCOUNTER — Telehealth (HOSPITAL_COMMUNITY): Payer: Self-pay | Admitting: *Deleted

## 2016-08-16 ENCOUNTER — Other Ambulatory Visit (HOSPITAL_COMMUNITY): Payer: Self-pay | Admitting: Nurse Practitioner

## 2016-08-16 DIAGNOSIS — O480 Post-term pregnancy: Secondary | ICD-10-CM

## 2016-08-16 NOTE — Telephone Encounter (Signed)
Preadmission screen [phone] interpreter number

## 2016-08-17 ENCOUNTER — Other Ambulatory Visit: Payer: Self-pay | Admitting: Advanced Practice Midwife

## 2016-08-19 ENCOUNTER — Ambulatory Visit (HOSPITAL_COMMUNITY)
Admission: RE | Admit: 2016-08-19 | Discharge: 2016-08-19 | Disposition: A | Discharge status: Home / Self Care | Payer: Self-pay | Source: Ambulatory Visit | Attending: Nurse Practitioner | Admitting: Nurse Practitioner

## 2016-08-19 DIAGNOSIS — Z3A40 40 weeks gestation of pregnancy: Secondary | ICD-10-CM | POA: Insufficient documentation

## 2016-08-19 DIAGNOSIS — O480 Post-term pregnancy: Secondary | ICD-10-CM | POA: Insufficient documentation

## 2016-08-21 ENCOUNTER — Encounter (HOSPITAL_COMMUNITY): Payer: Self-pay | Admitting: Anesthesiology

## 2016-08-21 ENCOUNTER — Inpatient Hospital Stay (HOSPITAL_COMMUNITY)
Admission: RE | Admit: 2016-08-21 | Discharge: 2016-08-23 | DRG: 775 | Disposition: A | Discharge status: Home / Self Care | Payer: Medicaid Other | Source: Ambulatory Visit | Attending: Obstetrics & Gynecology | Admitting: Obstetrics & Gynecology

## 2016-08-21 ENCOUNTER — Encounter (HOSPITAL_COMMUNITY): Payer: Self-pay

## 2016-08-21 VITALS — BP 102/62 | HR 80 | Temp 98.0°F | Resp 16 | Ht 61.0 in | Wt 166.0 lb

## 2016-08-21 DIAGNOSIS — Z87891 Personal history of nicotine dependence: Secondary | ICD-10-CM | POA: Diagnosis not present

## 2016-08-21 DIAGNOSIS — Z3A41 41 weeks gestation of pregnancy: Secondary | ICD-10-CM

## 2016-08-21 DIAGNOSIS — O480 Post-term pregnancy: Secondary | ICD-10-CM

## 2016-08-21 HISTORY — DX: Other psychoactive substance abuse, uncomplicated: F19.10

## 2016-08-21 HISTORY — DX: Major depressive disorder, single episode, unspecified: F32.9

## 2016-08-21 HISTORY — DX: Depression, unspecified: F32.A

## 2016-08-21 LAB — RPR: RPR Ser Ql: NONREACTIVE

## 2016-08-21 LAB — CBC
HEMATOCRIT: 38.2 % (ref 36.0–46.0)
Hemoglobin: 13 g/dL (ref 12.0–15.0)
MCH: 33.9 pg (ref 26.0–34.0)
MCHC: 34 g/dL (ref 30.0–36.0)
MCV: 99.5 fL (ref 78.0–100.0)
Platelets: 300 10*3/uL (ref 150–400)
RBC: 3.84 MIL/uL — ABNORMAL LOW (ref 3.87–5.11)
RDW: 14.5 % (ref 11.5–15.5)
WBC: 12.3 10*3/uL — AB (ref 4.0–10.5)

## 2016-08-21 LAB — RAPID URINE DRUG SCREEN, HOSP PERFORMED
Amphetamines: NOT DETECTED
BARBITURATES: NOT DETECTED
BENZODIAZEPINES: NOT DETECTED
COCAINE: NOT DETECTED
Opiates: NOT DETECTED
TETRAHYDROCANNABINOL: NOT DETECTED

## 2016-08-21 LAB — TYPE AND SCREEN
ABO/RH(D): A POS
ANTIBODY SCREEN: NEGATIVE

## 2016-08-21 MED ORDER — OXYCODONE-ACETAMINOPHEN 5-325 MG PO TABS: 2.0000 | ORAL_TABLET | ORAL | Status: DC | PRN

## 2016-08-21 MED ORDER — FENTANYL 2.5 MCG/ML BUPIVACAINE 1/10 % EPIDURAL INFUSION (WH - ANES)
INTRAMUSCULAR | Status: AC
  Filled 2016-08-21: qty 100

## 2016-08-21 MED ORDER — DIPHENHYDRAMINE HCL 50 MG/ML IJ SOLN: 12.5000 mg | INTRAMUSCULAR | Status: DC | PRN

## 2016-08-21 MED ORDER — OXYTOCIN 40 UNITS IN LACTATED RINGERS INFUSION - SIMPLE MED
2.5000 [IU]/h | INTRAVENOUS | Status: DC
  Filled 2016-08-21: qty 1000

## 2016-08-21 MED ORDER — MISOPROSTOL 200 MCG PO TABS
50.0000 ug | ORAL_TABLET | ORAL | Status: DC | PRN
  Administered 2016-08-21 (×2): 50 ug via ORAL
  Filled 2016-08-21 (×2): qty 1

## 2016-08-21 MED ORDER — LACTATED RINGERS IV SOLN
INTRAVENOUS | Status: DC
  Administered 2016-08-21 (×2): via INTRAVENOUS

## 2016-08-21 MED ORDER — LACTATED RINGERS IV SOLN: 500.0000 mL | INTRAVENOUS | Status: DC | PRN

## 2016-08-21 MED ORDER — EPHEDRINE 5 MG/ML INJ
10.0000 mg | INTRAVENOUS | Status: DC | PRN
  Filled 2016-08-21: qty 2

## 2016-08-21 MED ORDER — PHENYLEPHRINE 40 MCG/ML (10ML) SYRINGE FOR IV PUSH (FOR BLOOD PRESSURE SUPPORT)
80.0000 ug | PREFILLED_SYRINGE | INTRAVENOUS | Status: DC | PRN
  Filled 2016-08-21: qty 5

## 2016-08-21 MED ORDER — MISOPROSTOL 25 MCG QUARTER TABLET: 25.0000 ug | ORAL_TABLET | ORAL | Status: DC | PRN

## 2016-08-21 MED ORDER — LACTATED RINGERS IV SOLN
500.0000 mL | Freq: Once | INTRAVENOUS | Status: AC
  Administered 2016-08-21: 500 mL via INTRAVENOUS

## 2016-08-21 MED ORDER — OXYCODONE-ACETAMINOPHEN 5-325 MG PO TABS: 1.0000 | ORAL_TABLET | ORAL | Status: DC | PRN

## 2016-08-21 MED ORDER — FENTANYL 2.5 MCG/ML BUPIVACAINE 1/10 % EPIDURAL INFUSION (WH - ANES): 14.0000 mL/h | INTRAMUSCULAR | Status: DC | PRN

## 2016-08-21 MED ORDER — SOD CITRATE-CITRIC ACID 500-334 MG/5ML PO SOLN: 30.0000 mL | ORAL | Status: DC | PRN

## 2016-08-21 MED ORDER — TERBUTALINE SULFATE 1 MG/ML IJ SOLN
0.2500 mg | Freq: Once | INTRAMUSCULAR | Status: DC | PRN
  Filled 2016-08-21: qty 1

## 2016-08-21 MED ORDER — ACETAMINOPHEN 325 MG PO TABS: 650.0000 mg | ORAL_TABLET | ORAL | Status: DC | PRN

## 2016-08-21 MED ORDER — FENTANYL CITRATE (PF) 100 MCG/2ML IJ SOLN
100.0000 ug | INTRAMUSCULAR | Status: DC | PRN
  Administered 2016-08-21: 100 ug via INTRAVENOUS
  Filled 2016-08-21: qty 2

## 2016-08-21 MED ORDER — PHENYLEPHRINE 40 MCG/ML (10ML) SYRINGE FOR IV PUSH (FOR BLOOD PRESSURE SUPPORT)
PREFILLED_SYRINGE | INTRAVENOUS | Status: AC
  Filled 2016-08-21: qty 20

## 2016-08-21 MED ORDER — ONDANSETRON HCL 4 MG/2ML IJ SOLN: 4.0000 mg | Freq: Four times a day (QID) | INTRAMUSCULAR | Status: DC | PRN

## 2016-08-21 MED ORDER — OXYTOCIN BOLUS FROM INFUSION
500.0000 mL | Freq: Once | INTRAVENOUS | Status: AC
  Administered 2016-08-21: 500 mL via INTRAVENOUS

## 2016-08-21 MED ORDER — METHYLERGONOVINE MALEATE 0.2 MG/ML IJ SOLN
0.2000 mg | Freq: Once | INTRAMUSCULAR | Status: AC
  Administered 2016-08-21: 0.2 mg via INTRAMUSCULAR

## 2016-08-21 MED ORDER — LIDOCAINE HCL (PF) 1 % IJ SOLN
30.0000 mL | INTRAMUSCULAR | Status: DC | PRN
  Administered 2016-08-21: 30 mL via SUBCUTANEOUS
  Filled 2016-08-21: qty 30

## 2016-08-21 NOTE — Progress Notes (Signed)
Labor Progress Note Selenia CongoMontenegro Rosa is a 21 y.o. G3P0020 at 2329w0d presented for IOL for post dates  S:  Very uncomfortable w/ctx, recent dose IV pain meds, not helping.   O:  BP (!) 118/93   Pulse 93   Temp 98.5 F (36.9 C)   Resp 18   Ht 5\' 1"  (1.549 m)   Wt 75.3 kg (166 lb)   LMP 11/26/2015   BMI 31.37 kg/m  EFM: baseline 125 bpm/ mod variability/ + accels/ no decels  Toco: 1-2 SVE: Dilation: 7 Effacement (%): 100 Cervical Position: Posterior Station: 0 Presentation: Vertex Exam by:: Dalisa Forrer,cnm   A/P: 21 y.o. G3P0020 3229w0d  1. Labor: active 2. FWB: Cat I 3. Pain: requesting epidural Anticipate labor progression and SVD.  Donette LarryMelanie Asa Baudoin, CNM 9:18 PM

## 2016-08-21 NOTE — H&P (Signed)
LABOR ADMISSION HISTORY AND PHYSICAL  Norma Hardin is a 21 y.o. female G3P0020 with IUP at 8670w0d by US presenting for IOL for postdates. She reports +FMs, No LOF, no VB, no blurry vision, peripheral edema, and RUQ pain. She has a mild headache  She plans on breast feeding. She request Depo for birth control.  Dating: By [place] --->  Estimated Date of Delivery: 08/14/16   Prenatal History/Complications:  Past Medical History: Past Medical History:  Diagnosis Date  . Medical history non-contributory     Past Surgical History: Past Surgical History:  Procedure Laterality Date  . NO PAST SURGERIES      Obstetrical History: OB History    Gravida Para Term Preterm AB Living   3       2 0   SAB TAB Ectopic Multiple Live Births   2              Social History: Social History   Social History  . Marital status: Single    Spouse name: N/A  . Number of children: N/A  . Years of education: N/A   Social History Main Topics  . Smoking status: Former Smoker    Quit date: 12/12/2013  . Smokeless tobacco: Never Used  . Alcohol use 0.6 oz/week    1 Cans of beer per week     Comment: Stopped drinking 1 months ago. Patient drank   . Drug use: No     Comment: Patient quit 3 months ago. Has taken several times.    NONE SINCE 6 MTHS  . Sexual activity: Yes    Birth control/ protection: None   Other Topics Concern  . None   Social History Narrative  . None    Family History: History reviewed. No pertinent family history.  Allergies: No Known Allergies  Prescriptions Prior to Admission  Medication Sig Dispense Refill Last Dose  . folic acid (FOLVITE) 1 MG tablet Take 1 mg by mouth daily.   07/09/2015  . glycopyrrolate (ROBINUL) 1 MG tablet Take 1 tablet (1 mg total) by mouth 3 (three) times daily as needed. 90 tablet 0   . Prenatal Vit-Fe Fumarate-FA (PRENATAL MULTIVITAMIN) TABS tablet Take 1 tablet by mouth daily at 12 noon.   07/29/2015 at Unknown time  . promethazine  (PHENERGAN) 12.5 MG tablet Take 1 tablet (12.5 mg total) by mouth every 6 (six) hours as needed for nausea or vomiting. Can take two tablets if needed. 30 tablet 0   . terconazole (TERAZOL 7) 0.4 % vaginal cream Place 1 applicator vaginally at bedtime. 45 g 0   . terconazole (TERAZOL 7) 0.4 % vaginal cream Place 1 applicator vaginally at bedtime. 45 g 0      Review of Systems   All systems reviewed and negative except as stated in HPI  Blood pressure 110/67, pulse (!) 115, temperature 99 F (37.2 C), temperature source Oral, resp. rate 20, height 5\' 1"  (1.549 m), weight 75.3 kg (166 lb), last menstrual period 11/26/2015, unknown if currently breastfeeding. General appearance: alert and cooperative Extremities: Homans sign is negative, no sign of DVT Presentation: cephalic Fetal monitoringBaseline: 160 bpm, Variability: Good {> 6 bpm), Accelerations: Reactive and Decelerations: Absent Uterine activityNone Dilation: Closed Effacement (%): Thick Station: -3 Exam by:: Marcelino DusterMichelle, RN    Prenatal labs: ABO, Rh: A/Positive/-- (01/16 0000) Antibody: Negative (01/16 0000) Rubella: Immune RPR: Nonreactive (01/16 0000)  HBsAg: Negative (01/16 0000)  HIV: Non-reactive (01/16 0000)  GBS: Negative (05/21 0000)  Prenatal Transfer Tool  Maternal Diabetes: No Genetic Screening: Normal Maternal Ultrasounds/Referrals: Normal Fetal Ultrasounds or other Referrals:  None Maternal Substance Abuse:  Yes:  Type: Smoker, Cocaine- Reports no substance abuse after pregnancy Significant Maternal Medications:  None Significant Maternal Lab Results: Lab values include: Group B Strep negative  Results for orders placed or performed during the hospital encounter of 08/21/16 (from the past 24 hour(s))  CBC   Collection Time: 08/21/16  8:57 AM  Result Value Ref Range   WBC 12.3 (H) 4.0 - 10.5 K/uL   RBC 3.84 (L) 3.87 - 5.11 MIL/uL   Hemoglobin 13.0 12.0 - 15.0 g/dL   HCT 16.1 09.6 - 04.5 %   MCV 99.5  78.0 - 100.0 fL   MCH 33.9 26.0 - 34.0 pg   MCHC 34.0 30.0 - 36.0 g/dL   RDW 40.9 81.1 - 91.4 %   Platelets 300 150 - 400 K/uL    Patient Active Problem List   Diagnosis Date Noted  . Post term pregnancy at [redacted] weeks gestation 08/21/2016    Assessment: Norma Congo Hardin is a 21 y.o. G3P0020 at [redacted]w[redacted]d here for IOL for postdates.  #Labor:Induction protocol #Pain: Epidural if requested #FWB: Cat 1 #ID: GBS negative #MOF: breast #MOC:Depo #Circ:  no  [place] Shirley, DO Family Medicine Resident PGY-1  08/21/2016, 9:23 AM

## 2016-08-21 NOTE — Anesthesia Preprocedure Evaluation (Deleted)
Anesthesia Evaluation  Patient identified by MRN, date of birth, ID band Patient awake    Reviewed: Allergy & Precautions, NPO status , Patient's Chart, lab work & pertinent test results  Airway Mallampati: II  TM Distance: >3 FB Neck ROM: Full    Dental no notable dental hx.    Pulmonary neg pulmonary ROS, former smoker,    Pulmonary exam normal breath sounds clear to auscultation       Cardiovascular negative cardio ROS Normal cardiovascular exam Rhythm:Regular Rate:Normal     Neuro/Psych negative neurological ROS  negative psych ROS   GI/Hepatic negative GI ROS, Neg liver ROS,   Endo/Other  negative endocrine ROS  Renal/GU negative Renal ROS  negative genitourinary   Musculoskeletal negative musculoskeletal ROS (+)   Abdominal   Peds negative pediatric ROS (+)  Hematology negative hematology ROS (+)   Anesthesia Other Findings   Reproductive/Obstetrics negative OB ROS (+) Pregnancy                             Anesthesia Physical Anesthesia Plan  ASA: II  Anesthesia Plan: Epidural   Post-op Pain Management:    Induction:   PONV Risk Score and Plan:   Airway Management Planned:   Additional Equipment:   Intra-op Plan:   Post-operative Plan:   Informed Consent:   Plan Discussed with:   Anesthesia Plan Comments:         Anesthesia Quick Evaluation  

## 2016-08-21 NOTE — Anesthesia Pain Management Evaluation Note (Signed)
  CRNA Pain Management Visit Note  Patient: Norma Hardin, 21 y.o., female  "Hello I am a [facility]. We have an anesthesia team available at all times to provide care throughout the hospital, including epidural management and anesthesia for C-section. I don't know your plan for the delivery whether it a natural birth, water birth, IV sedation, nitrous supplementation, doula or epidural, but we want to meet your pain goals."   1.Was your pain managed to your expectations on prior hospitalizations?   No prior hospitalizations  2.What is your expectation for pain management during this hospitalization?     Labor support without medications  3.How can we help you reach that goal? unsure  Record the patient's initial score and the patient's pain goal.   Pain: 0  Pain Goal: 9  Conversation via RN [facility] wants you to be able to say your pain was always managed very well.  Cephus ShellingBURGER,Norma Hardin 08/21/2016

## 2016-08-21 NOTE — Progress Notes (Signed)
Patient ID: Norma Hardin, female   DOB: 02-Mar-1995, 21 y.o.   MRN: [id number] Labor Progress Note  S: Patient seen & examined for progress of labor. Patient comfortable. No epidural.   O: BP 110/67   Pulse (!) 115   Temp 99 F (37.2 C) (Oral)   Resp 20   Ht 5\' 1"  (1.549 m)   Wt 75.3 kg (166 lb)   LMP 11/26/2015   BMI 31.37 kg/m   FHT: 135 bpm, mod var, +accels, no decels TOCO: q1-4 min, patient looks comfortable during contractions  CVE: Dilation: Closed Effacement (%): Thick Cervical Position: Posterior Station: -3 Presentation: Vertex Exam by:: Marcelino DusterMichelle, RN    A&P: 21 y.o. G3P0020 [id number] for IOL for postdates.   Continue induction protocol, continue cytotec, foley bulb to be placed  Anticipate SVD  [place] Shirley, DO FM Resident PGY-1 08/21/2016 12:45 PM

## 2016-08-21 NOTE — Progress Notes (Signed)
Patient ID: Norma Hardin, female   DOB: 21-Nov-1995, 21 y.o.   MRN: [id number] Labor Progress Note  S: Patient examined for progress of labor. Patient comfortable, no epidural.    O: BP 110/68   Pulse 94   Temp 98.8 F (37.1 C) (Oral)   Resp 18   Ht 5\' 1"  (1.549 m)   Wt 75.3 kg (166 lb)   LMP 11/26/2015   BMI 31.37 kg/m   FHT: 135bpm, mod var, +accels, no decels TOCO: q1-4 min, patient looks comfortable during contractions  CVE: Dilation: Closed Effacement (%): Thick Cervical Position: Posterior Station: -2 Presentation: Vertex Exam by:: Marcelino DusterMichelle, RN   A&P: 21 y.o. G3P0020 [id number] for IOL for postdates  Currently on second dose of cytotec po Continue current management, foley bulb when able Anticipate SVD  [place] Shirley, DO FM Resident PGY-1 08/21/2016 3:19 PM

## 2016-08-21 NOTE — Progress Notes (Signed)
Patient ID: Norma Hardin, female   DOB: 16-Jan-1996, 21 y.o.   MRN: [id number] Labor Progress Note  S: Patient seen & examined for progress of labor. Patient has no epidural.   O: BP 110/68   Pulse 94   Temp 98.5 F (36.9 C)   Resp 18   Ht 5\' 1"  (1.549 m)   Wt 75.3 kg (166 lb)   LMP 11/26/2015   BMI 31.37 kg/m   FHT: 140bpm, mod var, +accels, no decels TOCO: q1-4 min, patient looks comfortable during contractions  CVE: Dilation: 1 Effacement (%): 50 Cervical Position: Posterior Station: -1 Presentation: Vertex Exam by:: Marcelino DusterMichelle, RN    A&P: 21 y.o. [id number] 7268w0d for IOL for postdates  Currently on cytotec x2. SROM @1910  with small amount of clear fluid. Continue current management Anticipate SVD  [place]il[place]Jordan Yuan Gann, DO FM Resident PGY-1 08/21/2016 7:25 PM

## 2016-08-21 NOTE — Progress Notes (Signed)
Patient ID: Norma Hardin, female   DOB: Oct 16, 1995, 21 y.o.   MRN: [id number] Labor Progress Note  S: Patient seen & examined for progress of labor. Patient does not have an epidural.   O: BP 110/68   Pulse 94   Temp 98.8 F (37.1 C) (Oral)   Resp 18   Ht 5\' 1"  (1.549 m)   Wt 75.3 kg (166 lb)   LMP 11/26/2015   BMI 31.37 kg/m   FHT: 145 bpm, mod var, +accels, no decels TOCO: q1-4 min, patient looks comfortable during contractions  CVE: Dilation: 1 Effacement (%): 50 Cervical Position: Posterior Station: -2 Presentation: Vertex Exam by:: A Showfety RN  A&P: 21 y.o. G3P0020 3758w0d IOL for postadates  Currently on cytotec x 2. Anticipate Foley Bulb Continue management Anticipate SVD  [place] Shirley, DO [place] Resident PGY-1 08/21/2016 6:53 PM

## 2016-08-22 MED ORDER — WITCH HAZEL-GLYCERIN EX PADS: 1.0000 "application " | MEDICATED_PAD | CUTANEOUS | Status: DC | PRN

## 2016-08-22 MED ORDER — PRENATAL MULTIVITAMIN CH
1.0000 | ORAL_TABLET | Freq: Every day | ORAL | Status: DC
  Administered 2016-08-22 – 2016-08-23 (×2): 1 via ORAL
  Filled 2016-08-22 (×2): qty 1

## 2016-08-22 MED ORDER — DIBUCAINE 1 % RE OINT: 1.0000 "application " | TOPICAL_OINTMENT | RECTAL | Status: DC | PRN

## 2016-08-22 MED ORDER — SENNOSIDES-DOCUSATE SODIUM 8.6-50 MG PO TABS
2.0000 | ORAL_TABLET | ORAL | Status: DC
  Administered 2016-08-22: 2 via ORAL
  Filled 2016-08-22: qty 2

## 2016-08-22 MED ORDER — ACETAMINOPHEN 325 MG PO TABS
650.0000 mg | ORAL_TABLET | ORAL | Status: DC | PRN
  Administered 2016-08-22: 650 mg via ORAL
  Filled 2016-08-22: qty 2

## 2016-08-22 MED ORDER — DIPHENHYDRAMINE HCL 25 MG PO CAPS: 25.0000 mg | ORAL_CAPSULE | Freq: Four times a day (QID) | ORAL | Status: DC | PRN

## 2016-08-22 MED ORDER — METHYLERGONOVINE MALEATE 0.2 MG/ML IJ SOLN: 0.2000 mg | INTRAMUSCULAR | Status: DC | PRN

## 2016-08-22 MED ORDER — ONDANSETRON HCL 4 MG/2ML IJ SOLN: 4.0000 mg | INTRAMUSCULAR | Status: DC | PRN

## 2016-08-22 MED ORDER — TETANUS-DIPHTH-ACELL PERTUSSIS 5-2.5-18.5 LF-MCG/0.5 IM SUSP
0.5000 mL | Freq: Once | INTRAMUSCULAR | Status: AC
  Administered 2016-08-23: 0.5 mL via INTRAMUSCULAR
  Filled 2016-08-22: qty 0.5

## 2016-08-22 MED ORDER — ONDANSETRON HCL 4 MG PO TABS: 4.0000 mg | ORAL_TABLET | ORAL | Status: DC | PRN

## 2016-08-22 MED ORDER — METHYLERGONOVINE MALEATE 0.2 MG PO TABS: 0.2000 mg | ORAL_TABLET | ORAL | Status: DC | PRN

## 2016-08-22 MED ORDER — SIMETHICONE 80 MG PO CHEW: 80.0000 mg | CHEWABLE_TABLET | ORAL | Status: DC | PRN

## 2016-08-22 MED ORDER — IBUPROFEN 600 MG PO TABS
600.0000 mg | ORAL_TABLET | Freq: Four times a day (QID) | ORAL | Status: DC
  Administered 2016-08-22 – 2016-08-23 (×7): 600 mg via ORAL
  Filled 2016-08-22 (×7): qty 1

## 2016-08-22 MED ORDER — BENZOCAINE-MENTHOL 20-0.5 % EX AERO
1.0000 "application " | INHALATION_SPRAY | CUTANEOUS | Status: DC | PRN
  Filled 2016-08-22: qty 56

## 2016-08-22 MED ORDER — COCONUT OIL OIL
1.0000 "application " | TOPICAL_OIL | Status: DC | PRN
  Administered 2016-08-23: 1 via TOPICAL
  Filled 2016-08-22: qty 120

## 2016-08-22 NOTE — Progress Notes (Signed)
UR chart review completed.  

## 2016-08-22 NOTE — Progress Notes (Signed)
Interpreter used to admit patient to mother/baby unit.  Interpreter ID # [id number] -Norma Hardin

## 2016-08-22 NOTE — Progress Notes (Signed)
Post Partum Day #1 Subjective: no complaints, up ad lib and tolerating PO; breast and bottle; contraception not discussed  Objective: Blood pressure 106/60, pulse 92, temperature 98.3 F (36.8 C), temperature source Oral, resp. rate 16, height 5\' 1"  (1.549 m), weight 75.3 kg (166 lb), last menstrual period 11/26/2015, SpO2 100 %, unknown if currently breastfeeding.  Physical Exam:  General: alert, cooperative and no distress Lochia: appropriate Uterine Fundus: firm DVT Evaluation: No evidence of DVT seen on physical exam.   Recent Labs  08/21/16 0857  HGB 13.0  HCT 38.2    Assessment/Plan: Plan for discharge tomorrow, Lactation consult and Contraception : ask pt re Nexplanon tomorrow   LOS: 1 day   Cam HaiSHAW, KIMBERLY CNM 08/22/2016, 8:19 AM

## 2016-08-22 NOTE — Lactation Note (Addendum)
This note was copied from a baby's chart. Lactation Consultation Note  Patient Name: [name] Today's Date: 08/22/2016   Did not see patient as patient has been giving formula since after birth x6 (10-15 ml) (attempt BF at birth).  Hx of ETOH during pregnancy and Hx of Cocaine use.  Breastfeeding is highly contraindicated during cases if there is cocaine use.     Addendum - RN called LC at 2000 and stated mom wants to try breastfeeding.  RN stated she latched infant on breast with LS-7.  Current drug screen labs are negative.   LC will need to follow-up with mom tomorrow.    Maternal Data Reason for exclusion: Substance abuse and/or alcohol abuse   Consult Status Consult Status: Complete    Lendon KaVann, Bretta Fees Walker 08/22/2016, 6:08 PM

## 2016-08-22 NOTE — Clinical Social Work Maternal (Signed)
  CLINICAL SOCIAL WORK MATERNAL/CHILD NOTE  Patient Details  Name: Norma Hardin MRN: [id number] Date of Birth: [birth year]  Date:  08/22/2016  Clinical Social Worker Initiating Note:  Laurey Arrow Date/ Time Initiated:  08/22/16/1154     Child's Name:  Norma Hardin   Legal Guardian:  Mother (FOB is Norma Hardin 09/12/83)   Need for Interpreter:  None   Date of Referral:  08/22/16     Reason for Referral:  Behavioral Health Issues, including SI , Current Substance Use/Substance Use During Pregnancy    Referral Source:  Central Nursery   Address:  [place] [address]  Phone number:  [phone]   Household Members:  Self, Significant Other   Natural Supports (not living in the home):   (FOB's family will be a support.)   Professional Supports: None   Employment: Unemployed   Type of Work:     Education:  9 to 11 years   Pensions consultant:   (CSW provided [name] with information to apply for Physicist, medical and Medicaid for family. )   Other Resources:  [facility]   Cultural/Religious Considerations Which May Impact Care:  Per [employer] Sheet, [name] is Catholic  Strengths:  Ability to meet basic needs , Home prepared for child    Risk Factors/Current Problems:  Mental Health Concerns , Substance Use    Cognitive State:  Alert , Able to Concentrate , Linear Thinking    Mood/Affect:  Happy , Calm , Relaxed , Interested , Comfortable    CSW Assessment: CSW met with [name] to complete an assessment for depression and hx of SA. CSW utilized [employer] to assist with language barrier.  When CSW arrived, FOB was holding infant on the couch and [name] was resting in bed.  [name] gave CSW permission to meet with [name] while FOB was present.   CSW inquired about [name]'s depression and [name] denied a diagnosis. [name] reported not having any signs or symptoms of depression. CSW provided education regarding Baby Blues vs PMADs and provided [name] with  information about support groups held at [place] encouraged [name] to evaluate her mental health throughout the postpartum period with the use of the New Mom Checklist developed by [facility] and notify a medical professional if symptoms arise.    CSW also inquired about [name]'s SA hx.  [name] acknowledged the use of alcohol and cocaine prior to [name]'s pregnancy confirmation.  [name] denied the use of all other illicit substance.  CSW informed [name] of hospital's SA policy and [name] was understanding.  CSW made [name] aware that the infant's UDS was negative and CSW will continue to monitor infant's CDS and will make a report to CPS if needed. [name] expressed that [name] was not concerned and all screens for infant should be negative. CSW offered [name] SA resources and [name] declined.  However, [name] was receptive to in-home services.  CSW made a referral to [facility].   CSW thanked [name] for meeting with CSW and provided [name] with CSW's contact information.  There are no barriers to d/c.    CSW Plan/Description:  Information/Referral to [employer] , No Further Intervention Required/No Barriers to Discharge, Patient/Family Education    Laurey Arrow, MSW, [employer] Clinical Social Work [phone]   Dimple Nanas, LCSW 08/22/2016, 12:03 PM

## 2016-08-22 NOTE — Progress Notes (Signed)
   08/22/16 0552  Vital Signs  BP 106/60  BP Location Left Arm  Patient Position (if appropriate) Semi-fowlers  BP Method Automatic  Pulse Rate 92  Pulse Rate Source Monitor  Resp 16  Temp 98.3 F (36.8 C)  Temp Source Oral  POSS Scale (Pasero Opioid Sedation Scale)  POSS *See Group Information* 1-Acceptable,Awake and alert  Oxygen Therapy  SpO2 100 %  O2 Device Room Air  Interpreter used during assessment to update feeds and pain score.  Interpreter ID # [id number]--Antonio

## 2016-08-23 MED ORDER — IBUPROFEN 600 MG PO TABS: 600.0000 mg | ORAL_TABLET | Freq: Four times a day (QID) | ORAL | 0 refills | Status: DC | PRN

## 2016-08-23 NOTE — Progress Notes (Signed)
Norma Hardin, Spanish Interpreter, present during morning assessment and patient education. Patient denied pain or any postpartum concerns. Education related to infant care including cord care, bulb syringe, safe sleep, breastfeeding and diet, alcohol and drug use with breastfeeding. Areas addressed were regular diet, drugs prescribed by a doctor, marijuana, street drugs including cocaine and the risk to to the infant while breastfeeding. Patient has a history of drug and alcohol use prior to pregnancy and neg drug screen postpartum. FOB present for education. He does not speak [place] either.

## 2016-08-23 NOTE — Discharge Summary (Signed)
OB Discharge Summary     Patient Name: Norma Hardin DOB: 10/10/1995 MRN: [id number]  Date of admission: 08/21/2016 Delivering MD: Donette Larry   Date of discharge: 08/23/2016  Admitting diagnosis: induction Intrauterine pregnancy: [redacted]w[redacted]d     Secondary diagnosis:  Active Problems:   Post term pregnancy at [redacted] weeks gestation  Additional problems: none     Discharge diagnosis: Term Pregnancy Delivered                                                                                                Post partum procedures:none  Augmentation: Cytotec  Complications: None  Hospital course:  Induction of Labor With Vaginal Delivery   21 y.o. yo [id number] at [redacted]w[redacted]d was admitted to the hospital 08/21/2016 for induction of labor.  Indication for induction: Postdates.  Patient had an uncomplicated labor course as follows: Membrane Rupture Time/Date: 7:08 PM ,08/21/2016   Intrapartum Procedures: Episiotomy: None [1]                                         Lacerations:  2nd degree [3];Perineal [11]  Patient had delivery of a Viable infant.  Information for the patient's newborn:  Congo Hardin, Boy Norma [[id number]]  Delivery Method: Vaginal, Spontaneous Delivery (Filed from Delivery Summary)   08/21/2016  Details of delivery can be found in separate delivery note.  Patient had a routine postpartum course. Patient is discharged home 08/23/16.  Physical exam  Vitals:   08/22/16 0055 08/22/16 0552 08/22/16 1833 08/23/16 0500  BP: 119/72 106/60 108/66 102/62  Pulse: 91 92 70 80  Resp: 18 16  16   Temp: 99.4 F (37.4 C) 98.3 F (36.8 C) 98.3 F (36.8 C) 98 F (36.7 C)  TempSrc: Oral Oral Oral Oral  SpO2: 99% 100%  100%  Weight:      Height:       General: alert and cooperative Lochia: appropriate Uterine Fundus: firm Incision: N/A DVT Evaluation: No evidence of DVT seen on physical exam. Labs: Lab Results  Component Value Date   WBC 12.3 (H) 08/21/2016   HGB 13.0  08/21/2016   HCT 38.2 08/21/2016   MCV 99.5 08/21/2016   PLT 300 08/21/2016   CMP Latest Ref Rng & Units 01/08/2016  Glucose 65 - 99 mg/dL 91  BUN 6 - 20 mg/dL 7  Creatinine 5.28 - 4.13 mg/dL 2.44  Sodium 010 - 272 mmol/L 135  Potassium 3.5 - 5.1 mmol/L 3.6  Chloride 101 - 111 mmol/L 102  CO2 22 - 32 mmol/L 23  Calcium 8.9 - 10.3 mg/dL 9.9  Total Protein 6.5 - 8.1 g/dL 8.2(H)  Total Bilirubin 0.3 - 1.2 mg/dL 0.4  Alkaline Phos 38 - 126 U/L 63  AST 15 - 41 U/L 24  ALT 14 - 54 U/L 23    Discharge instruction: per After Visit Summary and "Baby and Me Booklet".  After visit meds:  Allergies as of 08/23/2016   No Known Allergies     Medication  List    STOP taking these medications   ferrous sulfate 325 (65 FE) MG tablet     TAKE these medications   ibuprofen 600 MG tablet Commonly known as:  ADVIL,MOTRIN Take 1 tablet (600 mg total) by mouth every 6 (six) hours as needed.   prenatal multivitamin Tabs tablet Take 1 tablet by mouth daily at 12 noon.       Diet: routine diet  Activity: Advance as tolerated. Pelvic rest for 6 weeks.   Outpatient follow up:6 weeks Follow up Appt:No future appointments. Follow up Visit:No Follow-up on file.  Postpartum contraception: Depo Provera (declined Nex)  Newborn Data: Live born female  Birth Weight: 8 lb 9.6 oz (3901 g) APGAR: 9, 9  Baby Feeding: Bottle and Breast Disposition:home with mother   08/23/2016 Cam HaiSHAW, Bayle Calvo, CNM  8:29 AM

## 2016-08-23 NOTE — Lactation Note (Addendum)
This note was copied from a baby's chart. Lactation Consultation Note  Patient Name: Norma Hardin Today's Date: 08/23/2016    This LC spoke with patient's nurse, Christella HartiganBeverly Daly, RN. She said that this patient did not need to be seen by Lactation, as breastfeeding is going well (Mother is also using formula). Here is an excerpt from Christella HartiganBeverly Daly, RN's note that was written in the mother's chart when the hospital interpreter was used this morning: "Education related to infant care including cord care, bulb syringe, safe sleep, breastfeeding and diet, alcohol and drug use with breastfeeding. Areas addressed were regular diet, drugs prescribed by a doctor, marijuana, street drugs including cocaine and the risk to to the infant while breastfeeding. Patient has a history of drug and alcohol use prior to pregnancy and neg drug screen postpartum."  Lurline HareRichey, Alonah Lineback [facility] 08/23/2016, 10:33 AM

## 2016-08-23 NOTE — Discharge Instructions (Signed)
Instrucciones para la mamá sobre los cuidados en el hogar °(Home Care Instructions for Mom) °ACTIVIDAD °· Reanude sus actividades regulares de forma gradual. °· Descanse. Tome siestas cuando el bebé duerme. °· No levante objetos que pesen más de 10 libras (4,5 kg) hasta que el médico se lo autorice. °· Evite las actividades que demandan mucho esfuerzo y energía (que son extenuantes) hasta que el médico se lo autorice. Caminar a un ritmo tranquilo a moderado siempre es más seguro. °· Si tuvo un parto por cesárea: °? No pase la aspiradora, suba escaleras o conduzca un vehículo durante 4 o 6 semanas. °? Pídale a alguien que le brinde ayuda con las tareas domésticas hasta que pueda realizarlas por su cuenta. °? Haga ejercicios como se lo haya indicado el médico, si corresponde. ° °HEMORRAGIA VAGINAL °Probablemente continúe sangrando durante 4 o 6 semanas después del parto. Generalmente, la cantidad de sangre disminuye y el color se hace más claro con el transcurso del tiempo. Sin embargo, si usted está demasiado activa, el color de la sangre puede ser rojo brillante. Si necesita cambiarse la compresa higiénica en menos de una hora o tiene coágulos grandes: °· Permanezca acostada. °· Eleve los pies. °· Coloque compresas frías en la zona inferior del abdomen. °· Haga reposo. °· Comuníquese con su médico. °Si está amamantando, podría volver a tener su período entre las 8 semanas después del parto y el momento en que deje de amamantar. Si no está amamantando, volverá a tener su período 6 u 8 semanas después del parto. °CUIDADOS PERINEALES °La zona perineal o perineo, es la parte del cuerpo que se encuentra entre los muslos. Después del parto, esta zona necesita un cuidado especial. Siga las siguientes indicaciones como se lo haya indicado su médico. °· Tome baños de inmersión durante 15 o 20 minutos. °· Utilice apósitos o aerosoles analgésicos y cremas como se lo hayan indicado. °· No utilice tampones ni se haga duchas  vaginales hasta que el sangrado vaginal se haya detenido. °· Cada vez que vaya al baño: °? Use una botella perineal. °? Cámbiese el apósito. °? Use papel tisú en lugar de papel higiénico hasta que se cure la sutura. °· Haga ejercicios de Kegel todos los días. Los ejercicios Kegel ayudan a mantener los músculos que sostienen la vagina, la vejiga y los intestinos. Estos ejercicios se pueden realizar mientras está parada, sentada o acostada. Para hacer los ejercicios de Kegel: °? Tense los músculos del estómago y los que rodean el canal de parto. °? Mantenga esta posición durante unos segundos. °? Relájese. °? Repita hasta hacerlos 5 veces seguidas. °· Para evitar las hemorroides o que estas empeoren: °? Beba suficiente líquido para mantener la orina clara o de color amarillo pálido. °? Evite hacer fuerza al defecar. °? Tome los medicamentos y laxantes de venta libre como se lo haya indicado el médico. °CUIDADO DE LAS MAMAS °· Use un buen sostén. °· Evite tomar analgésicos de venta libre para las molestias de los pechos. °· Aplique hielo en los pechos para aliviar las molestias tanto como sea necesario: °? Ponga el hielo en una bolsa plástica. °? Coloque una toalla entre la piel y la bolsa de hielo. °? Aplique el hielo durante 20, o como se lo haya indicado el médico. ° °NUTRICIÓN °· Mantenga una dieta bien balanceada. °· No intente perder de peso rápidamente reduciendo el consumo de calorías. °· Tome sus vitaminas prenatales hasta el control de postparto o hasta que su médico se lo indique. ° °DEPRESIÓN POSTPARTO °  Puede sentir deseos de llorar sin motivo aparente y verse incapaz de enfrentarse a todos los cambios que implica tener un bebé. Este estado de ánimo se llama depresión postparto. La depresión postparto ocurre porque sus niveles hormonales sufren cambios después del parto. Si usted tiene depresión postparto, busque contención por parte de su pareja, sus amigos y su familia. Si la depresión no desaparece por  sí sola después de algunas semanas, concurra a su médico. °AUTOEXAMEN DE MAMAS °Realícese autoexámenes en el mismo momento cada mes. Si está amamantando, el mejor momento de controlar sus mamas es después de alimentar al bebé, cuando los pechos no están tan llenos. Si está amamantando y su período ya comenzó, controle sus mamas el día 5, 6 o 7 de su período. °Informe a su médico de cualquier protuberancia, bulto o secreción. Si está amamantando, las mamas normalmente tienen bultos. Esto es transitorio y no es un riesgo para la salud. °INTIMIDAD Y SEXUALIDAD °Debe evitar las relaciones sexuales durante al menos 3 o 4 semanas después del parto o hasta que el flujo de color rojo amarronado haya desaparecido completamente. Si no desea quedar embarazada nuevamente, use algún método anticonceptivo. Después del parto, puede quedar embarazada incluso si no ha tenido todavía el período. °SOLICITE ATENCIÓN MÉDICA SI: °· Se siente incapaz de controlar los cambios que implica tener un hijo y esos sentimientos no desaparecen después de algunas semanas. °· Detecta una protuberancia, bulto o secreción en sus mamas. ° °SOLICITE ATENCIÓN MÉDICA DE INMEDIATO SI: °· Debe cambiarse la compresa higiénica en 1 hora o menos. °· Tiene los siguientes síntomas: °? Dolor intenso o calambres en la parte inferior del abdomen. °? Una secreción vaginal con mal olor. °? Fiebre que no se alivia con los medicamentos. °? Una zona de la mama se pone roja y le causa dolor, y además usted tiene fiebre. °? Una pantorrilla enrojecida y con dolor. °? Repentino e intenso dolor en el pecho. °? Falta de aire. °? Micción dolorosa o con sangre. °? Problemas visuales. °· Vómitos durante 12 horas o más. °· Dolor de cabeza intenso. °· Tiene pensamientos serios acerca de lastimarse a usted misma o dañar al niño o a otra persona. ° °Esta información no tiene como fin reemplazar el consejo del médico. Asegúrese de hacerle al médico cualquier pregunta que  tenga. °Document Released: 01/31/2005 Document Revised: 05/25/2015 Document Reviewed: 08/04/2014 °Elsevier Interactive Patient Education © 2017 Else[employer] Inc. ° °

## 2016-11-04 IMAGING — US US OB COMP LESS 14 WK
1 series · 15 of 28 positions shown · non-contrast
Comparison: None.

CLINICAL DATA: Pregnant, abdominal pain, bleeding, beta HCG 112

EXAM:
OBSTETRIC <14 WK US AND TRANSVAGINAL OB US
TECHNIQUE: Both transabdominal and transvaginal ultrasound examinations were
performed for complete evaluation of the gestation as well as the
maternal uterus, adnexal regions, and pelvic cul-de-sac.
Transvaginal technique was performed to assess early pregnancy.

[Series 1: us ob comp less 14 wk · 30 acquisitions, 15 frames shown]
[im 1/30]
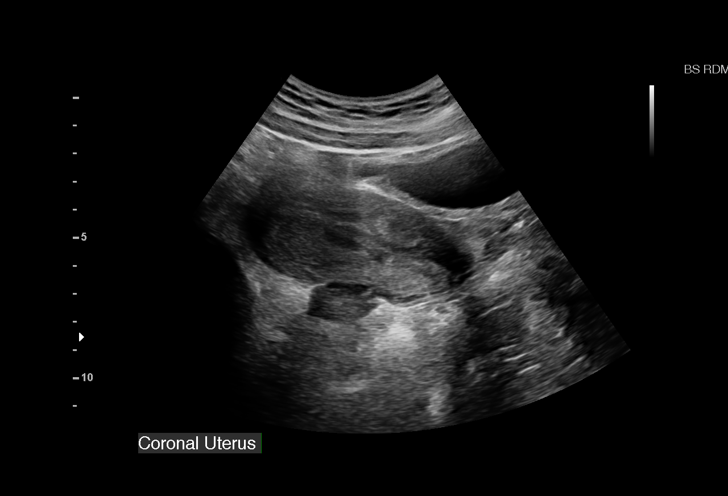
[im 3/30]
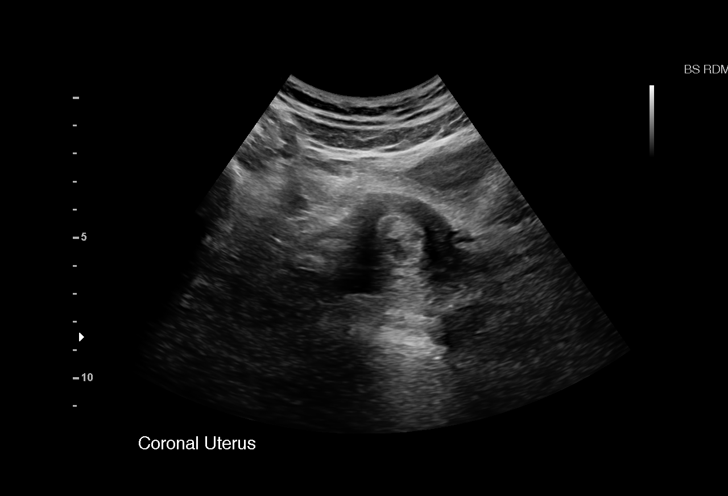
[im 5/30]
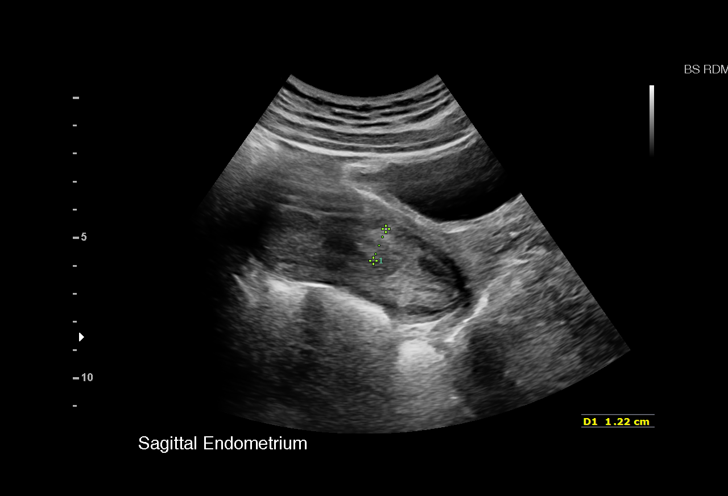
[im 7/30]
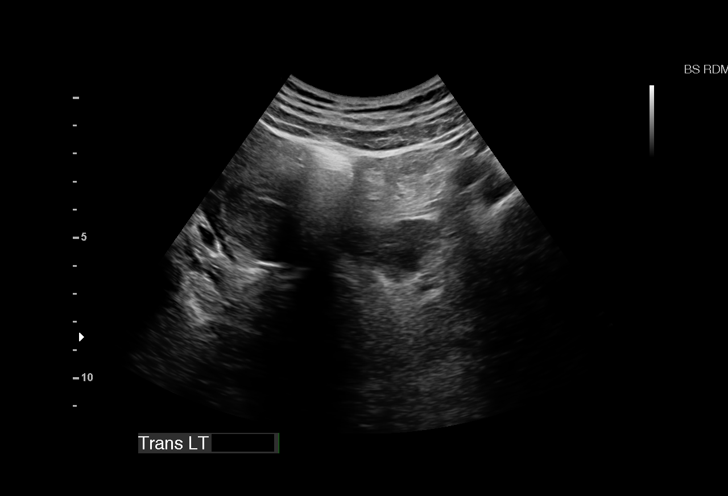
[im 9/30]
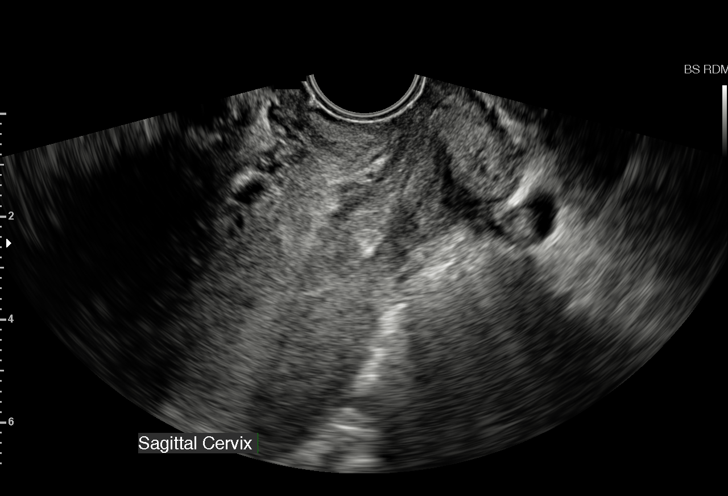
[im 11/30]
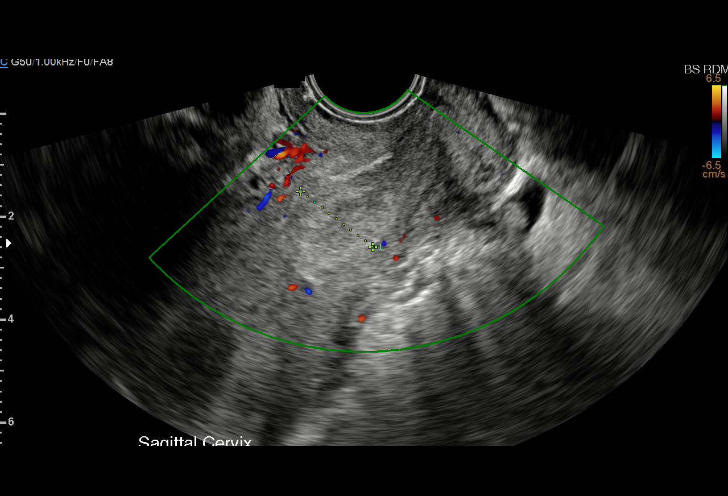
[im 13/30]
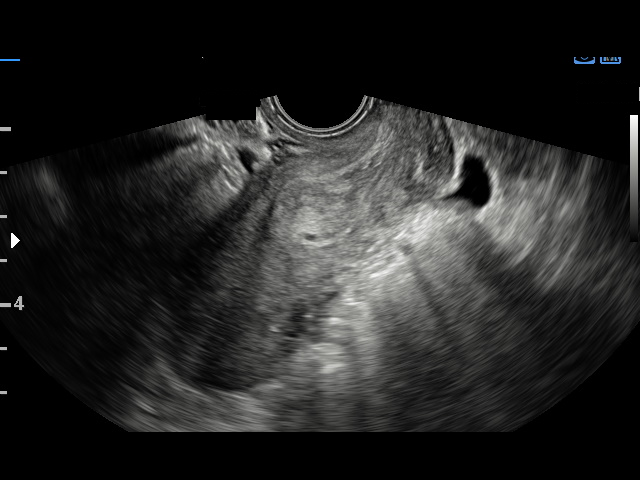
[im 16/30]
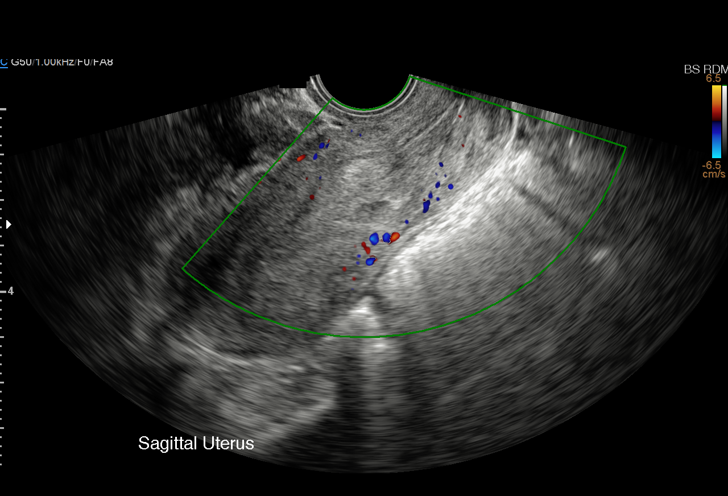
[im 17/30]
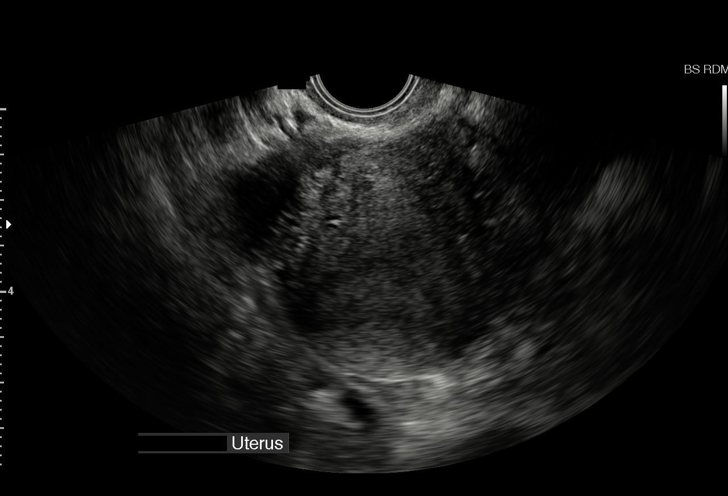
[im 19/30]
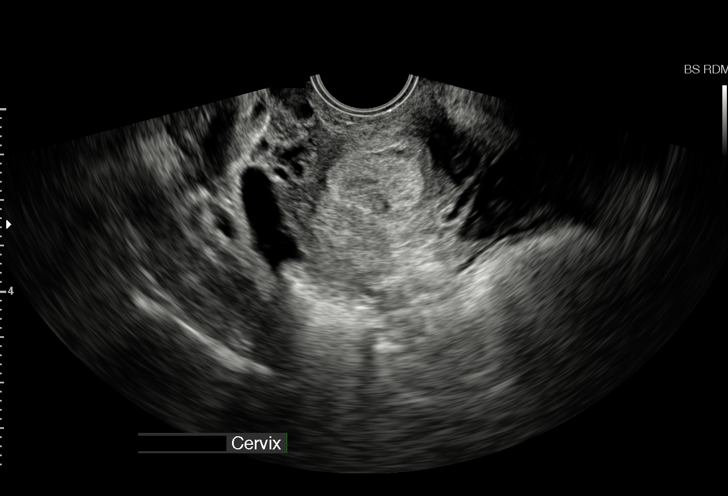
[im 21/30]
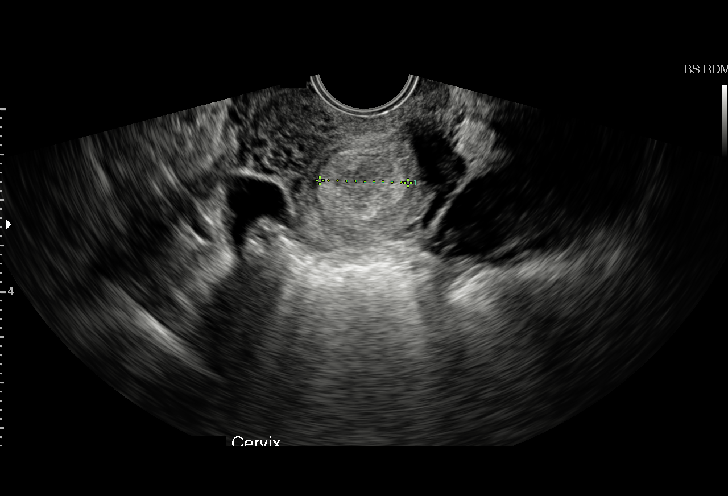
[im 23/30]
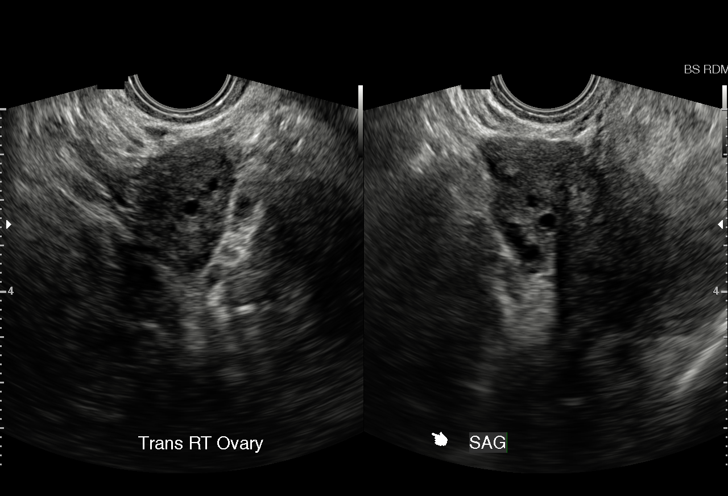
[im 25/30]
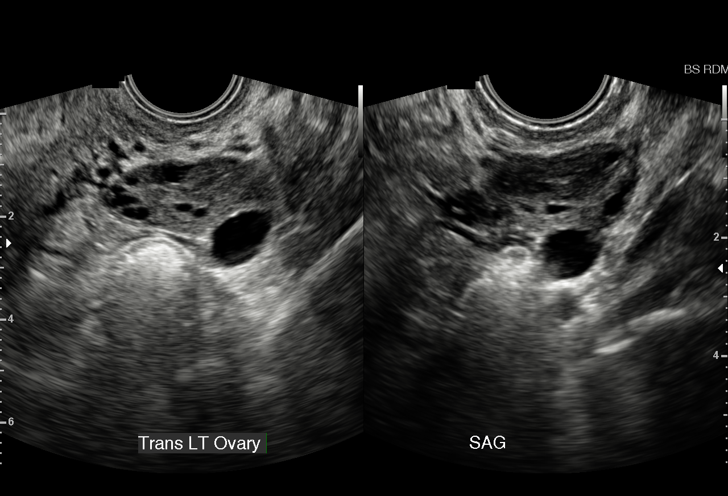
[im 27/30]
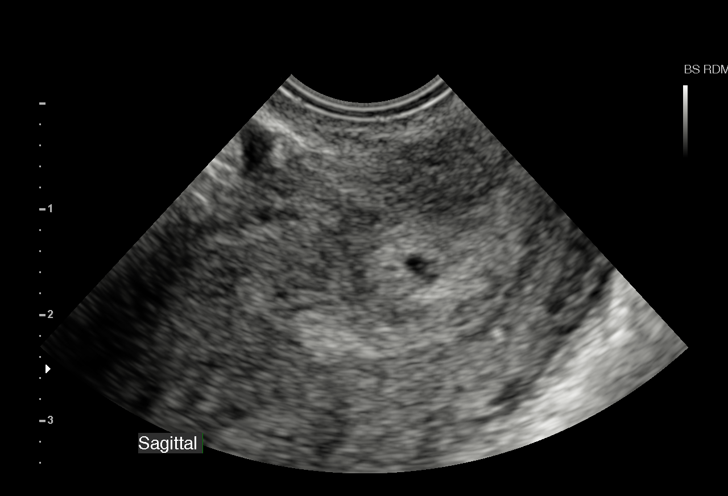
[im 30/30]
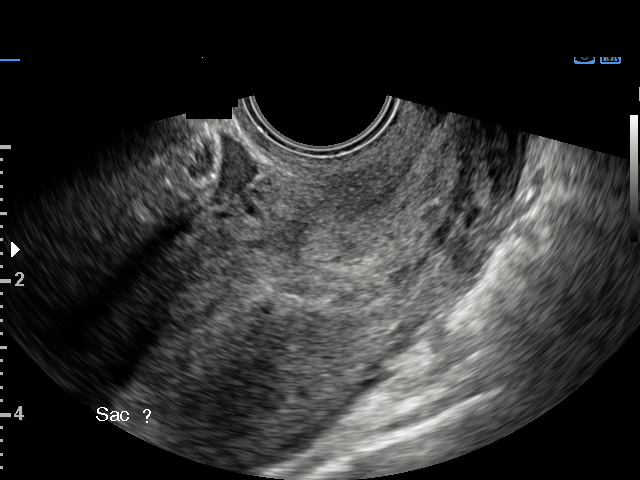

[15 of 28 positions shown; findings below may reference images not displayed]

FINDINGS: Intrauterine gestational sac: Not visualized.

Maternal uterus/adnexae: Focal thickening of the mid endometrial
complex measuring up to 18 mm, possibly reflecting blood
products/debris.

Right ovary is within normal limits.

Left ovary is within normal limits, noting a corpus luteal cyst.

Trace pelvic fluid.
IMPRESSION: No IUP is visualized. This is not unexpected given the low beta HCG.

By definition, this reflects a pregnancy of unknown location.
Differential considerations include an early normal IUP, abnormal
IUP/missed abortion, or nonvisualized ectopic pregnancy.

Serial beta HCG is suggested, supplemented by repeat sonography in
14 days (or earlier as clinically warranted).

## 2016-11-17 ENCOUNTER — Encounter (HOSPITAL_COMMUNITY): Payer: Self-pay | Admitting: Emergency Medicine

## 2016-11-17 ENCOUNTER — Emergency Department (HOSPITAL_COMMUNITY)
Admission: EM | Admit: 2016-11-17 | Discharge: 2016-11-17 | Disposition: A | Discharge status: Home / Self Care | Payer: Self-pay | Attending: Emergency Medicine | Admitting: Emergency Medicine

## 2016-11-17 DIAGNOSIS — L600 Ingrowing nail: Secondary | ICD-10-CM | POA: Insufficient documentation

## 2016-11-17 DIAGNOSIS — Z23 Encounter for immunization: Secondary | ICD-10-CM | POA: Insufficient documentation

## 2016-11-17 DIAGNOSIS — Z87891 Personal history of nicotine dependence: Secondary | ICD-10-CM | POA: Insufficient documentation

## 2016-11-17 LAB — COMPREHENSIVE METABOLIC PANEL
ALBUMIN: 4.3 g/dL (ref 3.5–5.0)
ALT: 44 U/L (ref 14–54)
ANION GAP: 9 (ref 5–15)
AST: 34 U/L (ref 15–41)
Alkaline Phosphatase: 93 U/L (ref 38–126)
BILIRUBIN TOTAL: 0.4 mg/dL (ref 0.3–1.2)
BUN: 10 mg/dL (ref 6–20)
CO2: 24 mmol/L (ref 22–32)
Calcium: 9.5 mg/dL (ref 8.9–10.3)
Chloride: 106 mmol/L (ref 101–111)
Creatinine, Ser: 0.61 mg/dL (ref 0.44–1.00)
GFR calc Af Amer: >60 mL/min (ref >60)
GFR calc non Af Amer: >60 mL/min (ref >60)
GLUCOSE: 101 mg/dL — AB (ref 65–99)
POTASSIUM: 3.9 mmol/L (ref 3.5–5.1)
SODIUM: 139 mmol/L (ref 135–145)
TOTAL PROTEIN: 7.6 g/dL (ref 6.5–8.1)

## 2016-11-17 LAB — PROTIME-INR
INR: 0.96
PROTHROMBIN TIME: 12.6 s (ref 11.4–15.2)

## 2016-11-17 LAB — CBC WITH DIFFERENTIAL/PLATELET
BASOS ABS: 0 10*3/uL (ref 0.0–0.1)
BASOS PCT: 0 %
Eosinophils Absolute: 0.2 10*3/uL (ref 0.0–0.7)
Eosinophils Relative: 2 %
HEMATOCRIT: 37.3 % (ref 36.0–46.0)
HEMOGLOBIN: 12.3 g/dL (ref 12.0–15.0)
Lymphocytes Relative: 25 %
Lymphs Abs: 3 10*3/uL (ref 0.7–4.0)
MCH: 31.1 pg (ref 26.0–34.0)
MCHC: 33 g/dL (ref 30.0–36.0)
MCV: 94.4 fL (ref 78.0–100.0)
MONOS PCT: 4 %
Monocytes Absolute: 0.5 10*3/uL (ref 0.1–1.0)
NEUTROS ABS: 8.3 10*3/uL — AB (ref 1.7–7.7)
NEUTROS PCT: 69 %
Platelets: 415 10*3/uL — ABNORMAL HIGH (ref 150–400)
RBC: 3.95 MIL/uL (ref 3.87–5.11)
RDW: 11.9 % (ref 11.5–15.5)
WBC: 12 10*3/uL — AB (ref 4.0–10.5)

## 2016-11-17 LAB — I-STAT CG4 LACTIC ACID, ED: Lactic Acid, Venous: 1.05 mmol/L (ref 0.5–1.9)

## 2016-11-17 MED ORDER — LIDOCAINE HCL 2 % IJ SOLN
20.0000 mL | Freq: Once | INTRAMUSCULAR | Status: AC
  Administered 2016-11-17: 400 mg via INTRADERMAL
  Filled 2016-11-17: qty 20

## 2016-11-17 MED ORDER — TETANUS-DIPHTH-ACELL PERTUSSIS 5-2.5-18.5 LF-MCG/0.5 IM SUSP
0.5000 mL | Freq: Once | INTRAMUSCULAR | Status: AC
  Administered 2016-11-17: 0.5 mL via INTRAMUSCULAR
  Filled 2016-11-17: qty 0.5

## 2016-11-17 MED ORDER — HYDROCODONE-ACETAMINOPHEN 5-325 MG PO TABS: 1.0000 | ORAL_TABLET | Freq: Four times a day (QID) | ORAL | 0 refills | Status: DC | PRN

## 2016-11-17 MED ORDER — CEPHALEXIN 500 MG PO CAPS: 500.0000 mg | ORAL_CAPSULE | Freq: Three times a day (TID) | ORAL | 0 refills | Status: DC

## 2016-11-17 MED ORDER — HYDROCODONE-ACETAMINOPHEN 5-325 MG PO TABS
1.0000 | ORAL_TABLET | Freq: Once | ORAL | Status: AC
  Administered 2016-11-17: 1 via ORAL
  Filled 2016-11-17: qty 1

## 2016-11-17 NOTE — Discharge Instructions (Signed)
Please change dressing regularly. Monitor for sign of infection.  Take antibiotic and pain medication as prescribed. Remember to pump and dump your milk until you are finish with your medications.  Follow up with foot specialist for further care.

## 2016-11-17 NOTE — ED Provider Notes (Signed)
[facility]PT Provider Note   CSN: [id number] Arrival date & time: 11/17/16  1445     History   Chief Complaint Chief Complaint  Patient presents with  . Toe Pain    HPI Norma Hardin is a 21 y.o. female.  HPI   21 year old Hispanic female with history of drug abuse including alcohol and cocaine presenting with complaints of toe pain. History obtained through a [place] interpreter. Patient reports for the past week she has had pain to her left great toe. Pain started after she accidentally hit it against a hard surface. She has noticed increasing swelling and redness and drainage coming from her toe. She described the pain as a sharp and throbbing sensation, persistent was with ambulation. She denies any associated fever or numbness, unable to recall last tetanus status and denies history of diabetes. Patient requesting for her toenail to be removed. Pt is postpartum and currently breast feeding.   Past Medical History:  Diagnosis Date  . Depression   . Drug abuse (HCC)    Cocaine, ETOH  . Medical history non-contributory     Patient Active Problem List   Diagnosis Date Noted  . Post term pregnancy at [redacted] weeks gestation 08/21/2016    Past Surgical History:  Procedure Laterality Date  . NO PAST SURGERIES      OB History    Gravida Para Term Preterm AB Living   SAB TAB Ectopic Multiple Live Births   2     0 1       Home Medications    Prior to Admission medications   Medication Sig Start Date End Date Taking? Authorizing Provider  ibuprofen (ADVIL,MOTRIN) 600 MG tablet Take 1 tablet (600 mg total) by mouth every 6 (six) hours as needed. 08/23/16   Arabella Merles, CNM  Prenatal Vit-Fe Fumarate-FA (PRENATAL MULTIVITAMIN) TABS tablet Take 1 tablet by mouth daily at 12 noon.    [provider]    Family History History reviewed. No pertinent family history.  Social History Social History  Substance Use Topics  . Smoking  status: Former Smoker    Quit date: 12/12/2013  . Smokeless tobacco: Never Used  . Alcohol use 0.6 oz/week    1 Cans of beer per week     Comment: Stopped drinking 1 months ago. Patient drank      Allergies   Patient has no known allergies.   Review of Systems Review of Systems  Constitutional: Negative for fever.  Skin: Positive for rash and wound.  Neurological: Negative for numbness.     Physical Exam Updated Vital Signs BP (!) 100/56   Pulse 81   Temp 97.9 F (36.6 C) (Oral)   Resp 16   LMP 11/08/2016 (LMP Unknown)   SpO2 98%   Physical Exam  Constitutional: She appears well-developed and well-nourished. No distress.  Patient is well-appearing no acute distress  HENT:  Head: Atraumatic.  Eyes: Conjunctivae are normal.  Neck: Neck supple.  Musculoskeletal: She exhibits tenderness (Left great toe: Toe is moderately edematous and erythematous with exquisite tenderness to palpation. Toenails cut short, both medial and lateral nail fold is inflamed with serous discharge.).  Neurological: She is alert.  Skin: No rash noted.  Psychiatric: She has a normal mood and affect.  Nursing note and vitals reviewed.    ED Treatments / Results  Labs (all labs ordered are listed, but only abnormal results are displayed) Labs Reviewed  COMPREHENSIVE METABOLIC PANEL - Abnormal; Notable for the following:       Result Value   Glucose, Bld 101 (*)    All other components within normal limits  CBC WITH DIFFERENTIAL/PLATELET - Abnormal; Notable for the following:    WBC 12.0 (*)    Platelets 415 (*)    Neutro Abs 8.3 (*)    All other components within normal limits  CULTURE, BLOOD (ROUTINE X 2)  CULTURE, BLOOD (ROUTINE X 2)  PROTIME-INR  I-STAT CG4 LACTIC ACID, ED    EKG  EKG Interpretation None       Radiology No results found.  Procedures .Marland KitchenIncision and Drainage Date/Time: 11/17/2016 6:03 PM Performed by: Fayrene Helper Authorized by: Fayrene Helper   Consent:      Consent obtained:  Verbal   Consent given by:  Patient   Risks discussed:  Bleeding, infection and pain   Alternatives discussed:  Delayed treatment and referral Location:    Indications for incision and drainage: paronychia.   Size:  4cm   Location:  Lower extremity   Lower extremity location:  Toe   Toe location:  L big toe Pre-procedure details:    Skin preparation:  Betadine Anesthesia (see MAR for exact dosages):    Anesthesia method:  Nerve block   Block needle gauge:  25 G   Block anesthetic:  Lidocaine 2% w/o epi   Block technique:  Digital nerve block   Block injection procedure:  Anatomic landmarks identified   Block outcome:  Anesthesia achieved Procedure type:    Complexity:  Complex Procedure details:    Needle aspiration: no     Incision type: Left great toe anesthetize, entire toenail was removed using 11 blade, and hemostat.  surgical debridement of devitalized tissues.  pressure dressing and coban applied for hemostasis.   Incision depth:  Subfascial   Scalpel blade:  11   Wound management:  Irrigated with saline, extensive cleaning, debrided and probed and deloculated   Drainage:  Bloody and purulent   Drainage amount:  Moderate   Wound treatment:  Wound left open   Packing materials:  None Post-procedure details:    Patient tolerance of procedure:  Tolerated well, no immediate complications   (including critical care time)  Medications Ordered in ED Medications  lidocaine (XYLOCAINE) 2 % (with pres) injection 400 mg (400 mg Intradermal Given 11/17/16 1729)  HYDROcodone-acetaminophen (NORCO/VICODIN) 5-325 MG per tablet 1 tablet (1 tablet Oral Given 11/17/16 1729)  Tdap (BOOSTRIX) injection 0.5 mL (0.5 mLs Intramuscular Given 11/17/16 1729)     Initial Impression / Assessment and Plan / ED Course  I have reviewed the triage vital signs and the nursing notes.  Pertinent labs & imaging results that were available during my care of the patient were  reviewed by me and considered in my medical decision making (see chart for details).     BP (!) 100/56   Pulse 81   Temp 97.9 F (36.6 C) (Oral)   Resp 16   LMP 11/08/2016 (LMP Unknown)   SpO2 98%    Final Clinical Impressions(s) / ED Diagnoses   Final diagnoses:  Ingrown left big toenail    New Prescriptions New Prescriptions   CEPHALEXIN (KEFLEX) 500 MG CAPSULE    Take 1 capsule (500 mg total) by mouth 3 (three) times daily. 2 caps po bid x 7 days   HYDROCODONE-ACETAMINOPHEN (NORCO/VICODIN) 5-325 MG TABLET    Take 1 tablet by mouth every 6 (six) hours as needed for  moderate pain or severe pain.   5:24 PM Patient presents with left toe pain. Findings suggestive of a paronychia. Patient will benefit from incision and drainage as well as removal for toenail. Will give Tdap.   6:08 PM Pt has paronychia involving both side of nail fold.  Entire nail was removed by me.  Surgical debridement of devitalized tissue using 11 blade and pick up.  Wound were extensively irrigated with 500cc of NS.  Dressing and coban placed.    Pt is currently post partum and nursing, recommend pump and dump for the next 4 days while on antibiotic and pain medications.  Podiatry referral given.  Wound care instruction given. Return precaution discussed.  Info were communicate using language interpreter.  In order to decrease risk of narcotic abuse. Pt's record were checked using the [place] Controlled Substance database.     Fayrene Helper, PA-C 11/17/16 1816    Arby Barrette, MD 11/17/16 [phone]

## 2016-11-17 NOTE — ED Notes (Signed)
Discharge completed with [facility] interpreter

## 2016-11-17 NOTE — ED Triage Notes (Signed)
Pt to ER with complaint of left toe pain. Significant swelling and drainage noted to left great toe. States has been there one week, possibly related to ingrown toenails per patient. Pt HR 110 at triage and BP 97/44. NAD. A/ ox4.

## 2016-11-22 LAB — CULTURE, BLOOD (ROUTINE X 2)
CULTURE: NO GROWTH
Culture: NO GROWTH
SPECIAL REQUESTS: ADEQUATE
Special Requests: ADEQUATE

## 2016-12-03 ENCOUNTER — Inpatient Hospital Stay (HOSPITAL_COMMUNITY): Admission: AD | Admit: 2016-12-03 | Payer: Self-pay | Source: Ambulatory Visit | Admitting: Family Medicine

## 2017-04-14 ENCOUNTER — Other Ambulatory Visit: Payer: Self-pay | Admitting: Physician Assistant

## 2017-04-14 DIAGNOSIS — N6320 Unspecified lump in the left breast, unspecified quadrant: Secondary | ICD-10-CM

## 2017-04-25 ENCOUNTER — Inpatient Hospital Stay: Admission: RE | Admit: 2017-04-25 | Payer: Self-pay | Source: Ambulatory Visit

## 2017-06-05 IMAGING — US US OB TRANSVAGINAL
1 series · 15 of 28 positions shown · non-contrast
Comparison: Pelvic ultrasound performed 12/13/2014

CLINICAL DATA: Acute onset of vaginal bleeding.  Initial encounter.

EXAM:
OBSTETRIC <14 WK US AND TRANSVAGINAL OB US
TECHNIQUE: Both transabdominal and transvaginal ultrasound examinations were
performed for complete evaluation of the gestation as well as the
maternal uterus, adnexal regions, and pelvic cul-de-sac.
Transvaginal technique was performed to assess early pregnancy.

[Series 1: us ob transvaginal · 15 of 69 slices shown]
[im 1/69]
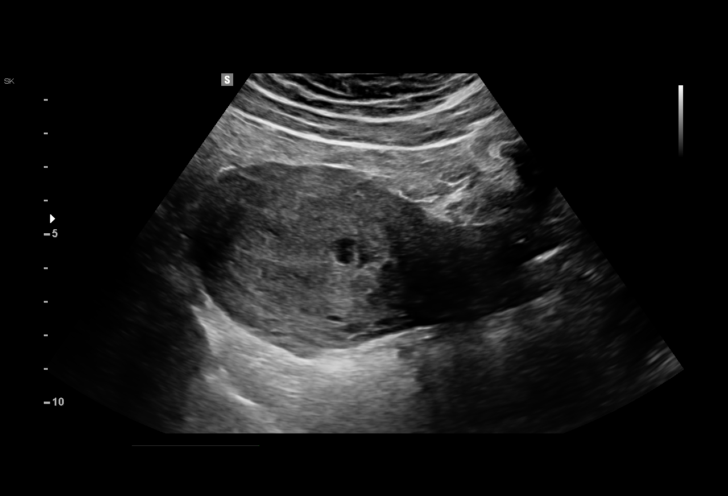
[im 6/69]
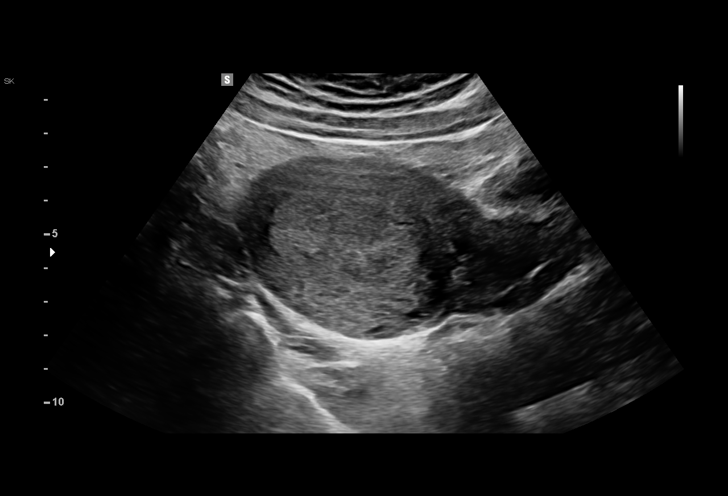
[im 11/69]
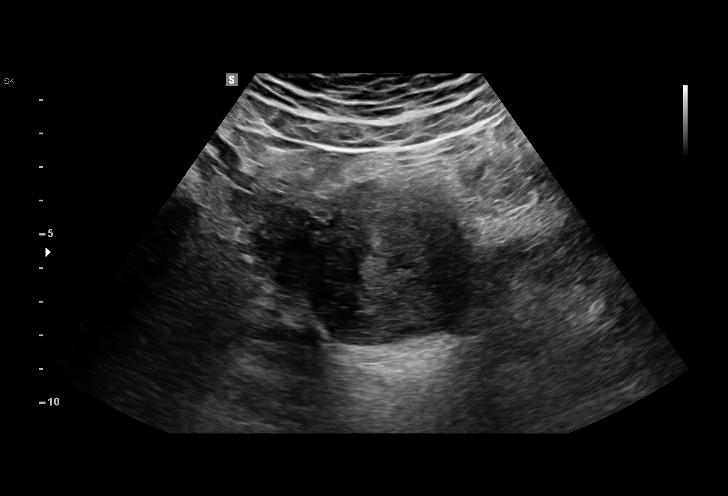
[im 16/69]
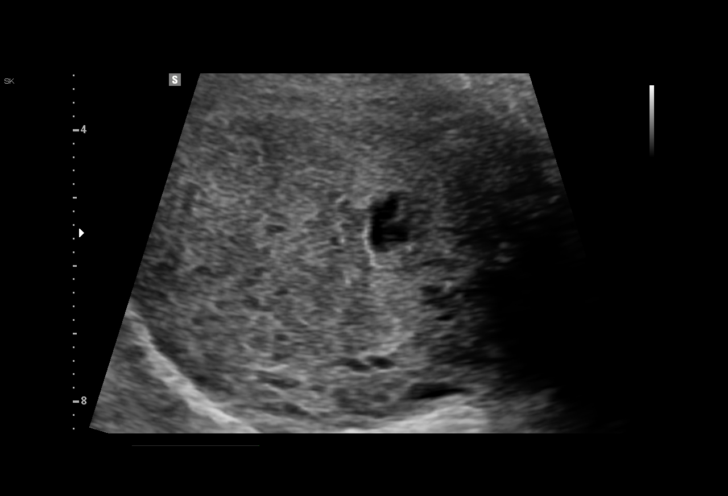
[im 21/69]
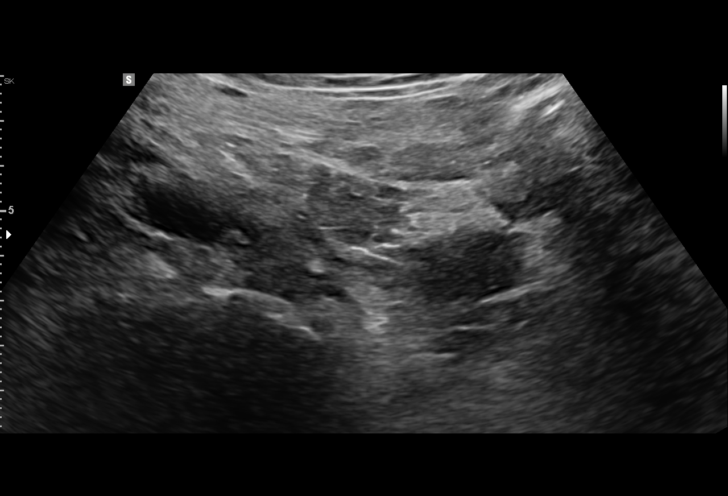
[im 26/69]
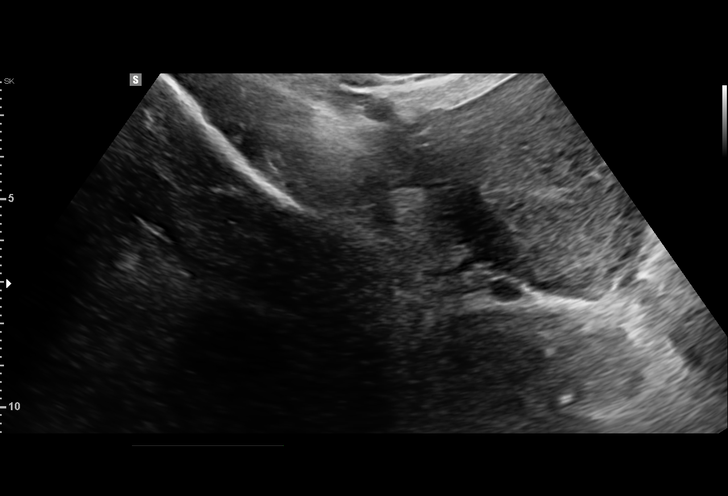
[im 31/69]
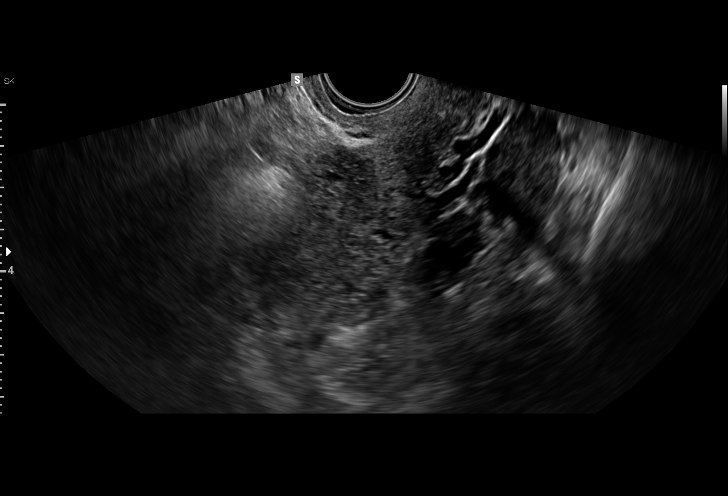
[im 36/69]
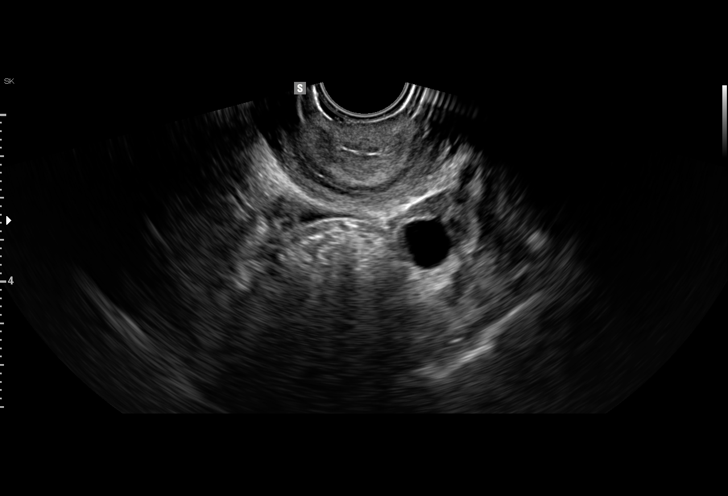
[im 38/69]
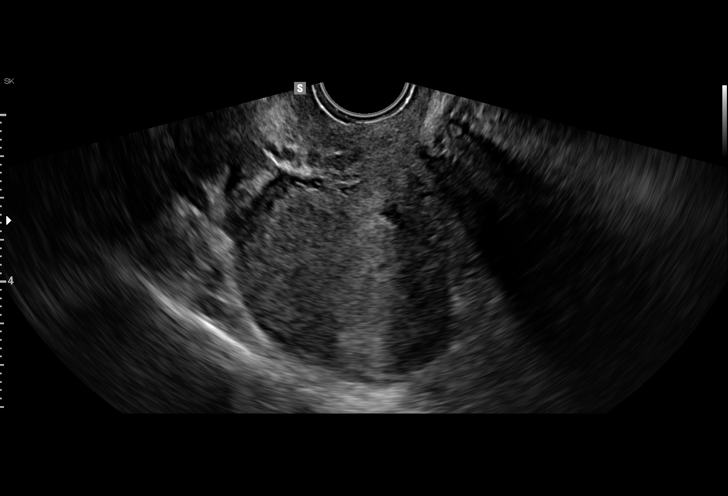
[im 43/69]
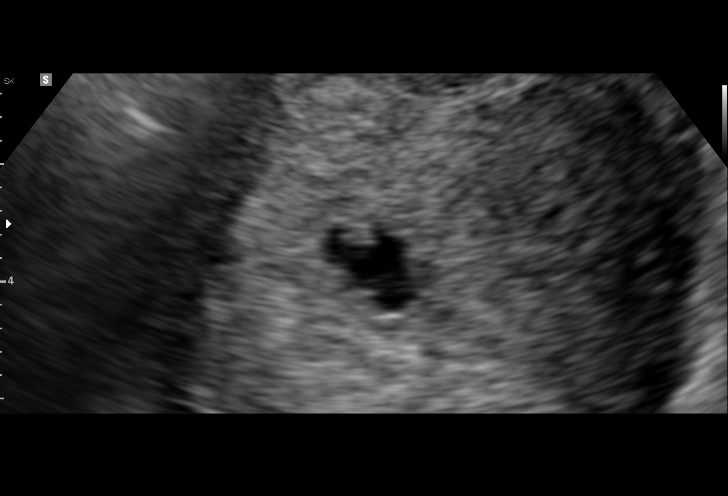
[im 48/69]
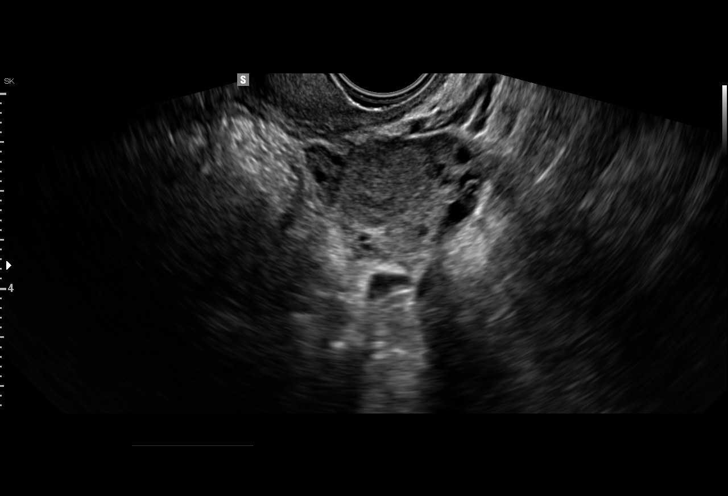
[im 53/69]
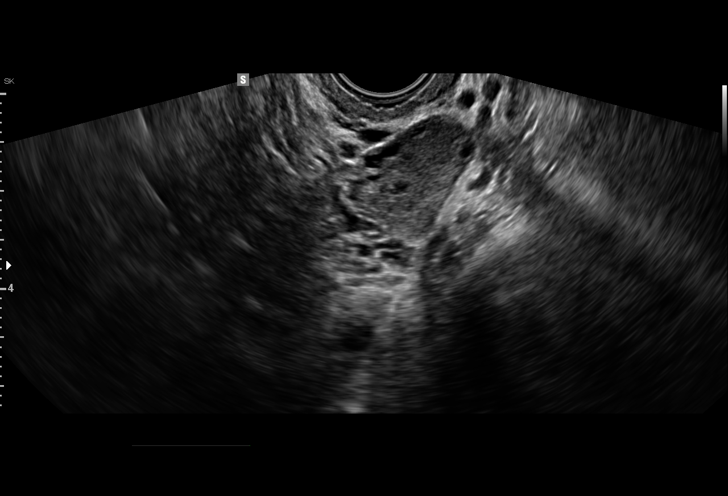
[im 58/69]
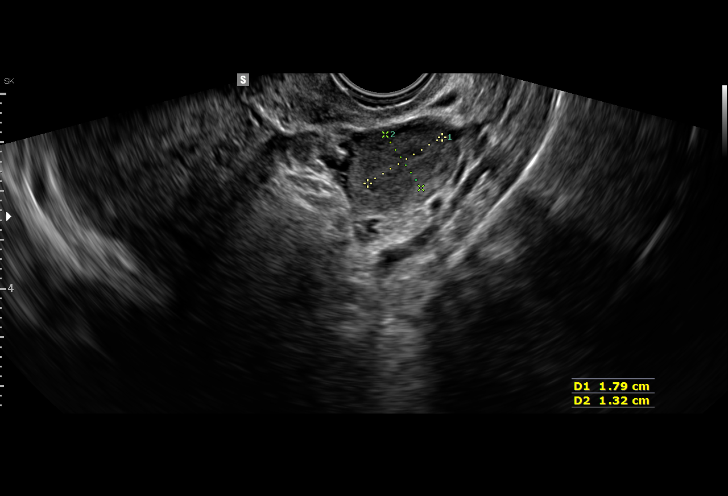
[im 63/69]
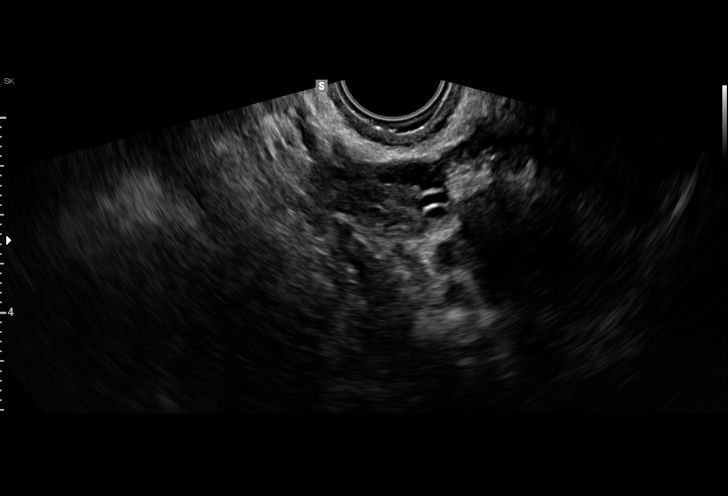
[im 69/69]
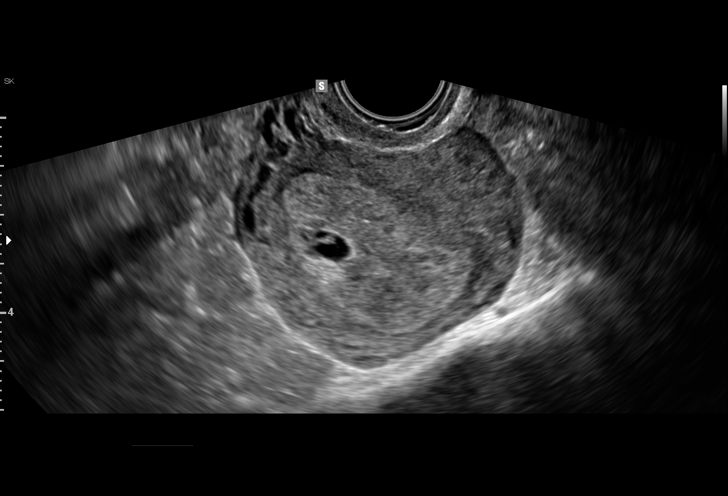

[15 of 28 positions shown; findings below may reference images not displayed]

FINDINGS: Intrauterine gestational sac: Single; visualized and normal in
shape.

Yolk sac:  Yes

Embryo:  Yes

Cardiac Activity: Yes

Heart Rate: 114  bpm

CRL:  2.6  mm   5 w   6 d                  US EDC: 03/09/2016

Subchorionic hemorrhage:  None visualized.

Maternal uterus/adnexae: The uterus is otherwise unremarkable.

The ovaries are unremarkable in appearance. The right ovary measures
3.6 x 1.9 x 1.9 cm, while the left ovary measures 1.8 x 1.3 x
cm. No suspicious adnexal masses are seen; there is no evidence for
ovarian torsion.

Trace free fluid is seen within the pelvic cul-de-sac.
IMPRESSION: Single live intrauterine pregnancy noted, with a crown-rump length
of 2.6 mm, corresponding to a gestational age of 5 weeks 6 days.
This does not match the gestational age by LMP, and reflects a new
estimated date of delivery March 09, 2016.

## 2018-06-05 ENCOUNTER — Other Ambulatory Visit (HOSPITAL_COMMUNITY): Payer: Self-pay | Admitting: *Deleted

## 2018-06-05 DIAGNOSIS — N6320 Unspecified lump in the left breast, unspecified quadrant: Secondary | ICD-10-CM

## 2018-06-26 ENCOUNTER — Encounter (HOSPITAL_COMMUNITY): Payer: Self-pay

## 2018-06-26 ENCOUNTER — Ambulatory Visit (HOSPITAL_COMMUNITY)
Admission: RE | Admit: 2018-06-26 | Discharge: 2018-06-26 | Disposition: A | Discharge status: Home / Self Care | Payer: Self-pay | Source: Ambulatory Visit | Attending: Obstetrics and Gynecology | Admitting: Obstetrics and Gynecology

## 2018-06-26 ENCOUNTER — Other Ambulatory Visit: Payer: Self-pay

## 2018-06-26 VITALS — BP 104/62 | Temp 98.5°F | Wt 126.0 lb

## 2018-06-26 DIAGNOSIS — N6322 Unspecified lump in the left breast, upper inner quadrant: Secondary | ICD-10-CM

## 2018-06-26 DIAGNOSIS — Z01419 Encounter for gynecological examination (general) (routine) without abnormal findings: Secondary | ICD-10-CM

## 2018-06-26 NOTE — Addendum Note (Signed)
Encounter addended by: Lynnell Dike, LPN on: 0/10/3816 2:43 PM  Actions taken: Order list changed

## 2018-06-26 NOTE — Patient Instructions (Signed)
Explained breast self awareness with Selenia Congo Rosa. Let patient know [facility] will cover Pap smears every 3 years unless has a history of abnormal Pap smears. Referred patient to the [facility] for a left breast ultrasound. Appointment scheduled for Thursday, Jun 28, 2018 at 1230. Patient aware of appointment and will be there. Let patient know will follow up with her within the next couple weeks with results of Pap smear by letter or phone. Discussed smoking cessation with patient. Referred to the [facility] Quitline and gave resources to the free smoking cessation classes at [facility]. Selenia Congo Rosa verbalized understanding.  Maxwell Lemen, Kathaleen Maser, RN 2:21 PM

## 2018-06-26 NOTE — Progress Notes (Signed)
Complaints of left breast lump x one year.  Pap Smear: Pap smear completed today. Per patient has never had a Pap smear completed. No Pap smear results are in Epic.  Physical exam: Breasts Right breast larger than left breast that per patient changed after she gave birth in July 2018. Patient stated she only breastfeeds with the right breast. No skin abnormalities bilateral breasts. No nipple retraction bilateral breasts. Patient is currently breastfeeding x since July 2018. No lymphadenopathy. No lumps palpated right breast. Palpated a lump within the left breast at 11 o'clock 1 cm from the nipple. No complaints of pain or tenderness on exam. Referred patient to the [facility] for a left breast ultrasound. Appointment scheduled for Thursday, Jun 28, 2018 at 1230.        Pelvic/Bimanual   Ext Genitalia No lesions, no swelling and no discharge observed on external genitalia.         Vagina Vagina pink and normal texture. No lesions or discharge observed in vagina.          Cervix Cervix is present. Cervix pink and of normal texture. No discharge observed.     Uterus Uterus is present and palpable. Uterus in normal position and normal size.        Adnexae Bilateral ovaries present and palpable. No tenderness on palpation.         Rectovaginal No rectal exam completed today since patient had no rectal complaints. No skin abnormalities observed on exam.    Smoking History: Patient is a current smoker. Discussed smoking cessation with patient. Referred to the [facility] Quitline and gave resources to the free smoking cessation classes at [facility].  Patient Navigation: Patient education provided. Access to services provided for patient through [facility] program. Spanish interpreter provided.   Breast and Cervical Cancer Risk Assessment: Patient has no family history of breast cancer, known genetic mutations, or radiation treatment to the chest before age 75. Patient has no history  of cervical dysplasia, immunocompromised, or DES exposure in-utero. Breast cancer risk assessment completed. No breast cancer risk calculated due to patient is less than 10 years old.  Used Spanish interpreter [employer] from [place].

## 2018-06-28 ENCOUNTER — Ambulatory Visit
Admission: RE | Admit: 2018-06-28 | Discharge: 2018-06-28 | Disposition: A | Discharge status: Home / Self Care | Payer: No Typology Code available for payment source | Source: Ambulatory Visit | Attending: Obstetrics and Gynecology | Admitting: Obstetrics and Gynecology

## 2018-06-28 ENCOUNTER — Other Ambulatory Visit (HOSPITAL_COMMUNITY): Payer: Self-pay | Admitting: Obstetrics and Gynecology

## 2018-06-28 ENCOUNTER — Other Ambulatory Visit: Payer: Self-pay

## 2018-06-28 DIAGNOSIS — N6320 Unspecified lump in the left breast, unspecified quadrant: Secondary | ICD-10-CM

## 2018-06-29 LAB — CYTOLOGY - PAP
Diagnosis: UNDETERMINED — AB
HPV: DETECTED — AB

## 2018-07-02 ENCOUNTER — Encounter (HOSPITAL_COMMUNITY): Payer: Self-pay | Admitting: *Deleted

## 2018-07-10 ENCOUNTER — Telehealth (HOSPITAL_COMMUNITY): Payer: Self-pay

## 2018-07-10 NOTE — Telephone Encounter (Signed)
Spoke with patient via interpreter Natale Lay. Informed patient that her pap smear was abnormal with a result of (ASCUS, +HPV) and based on this result and her age her next pap smear will be due in 1 year. Patient voiced understanding.

## 2018-07-12 IMAGING — US US MFM FETAL BPP W/O NON-STRESS
1 series · 15 of 28 positions shown · non-contrast
Comparison: none

BERU

1  GIORGI JUMPER              [id number]      [id number]     [id number]
Indications
40 weeks gestation of pregnancy
Postdate pregnancy (40-42 weeks)
OB History
Blood Type:            Height:  5'1"   Weight (lb):  127       BMI:
Gravidity:    3          SAB:   2
Fetal Evaluation
Num Of Fetuses:     1
Fetal Heart         140
Rate(bpm):
Cardiac Activity:   Observed
Presentation:       Cephalic
Placenta:           Anterior, above cervical os
Amniotic Fluid
AFI FV:      Subjectively within normal limits
AFI Sum(cm)     %Tile       Largest Pocket(cm)
12.34           53
RUQ(cm)       RLQ(cm)       LUQ(cm)        LLQ(cm)
7.36          0.76          4.22           0
Biophysical Evaluation
Amniotic F.V:   Within normal limits       F. Tone:        Observed
F. Movement:    Observed                   Score:          [DATE]
F. Breathing:   Observed
Gestational Age
LMP:           38w 1d        Date:  11/26/15                 EDD:   09/01/16
Best:          40w 5d     Det. By:  Early Ultrasound         EDD:   08/14/16
(01/31/16)
Anatomy
Stomach:               Appears normal, left   Bladder:                Appears normal
sided
Kidneys:               Appear normal
Cervix Uterus Adnexa
Cervix
Not visualized (advanced GA >45wks)
Impression
INDICATION: 20 yr old [id number] at 30w9d for BPP secondary to
past due date. Remote read.

[Series 1: us mfm fetal bpp w/o non-stress · 36 acquisitions, 15 frames shown]
[im 1/36]
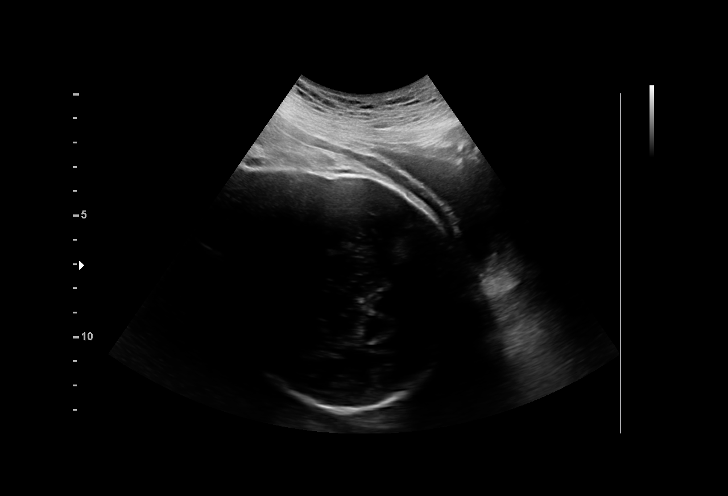
[im 3/36]
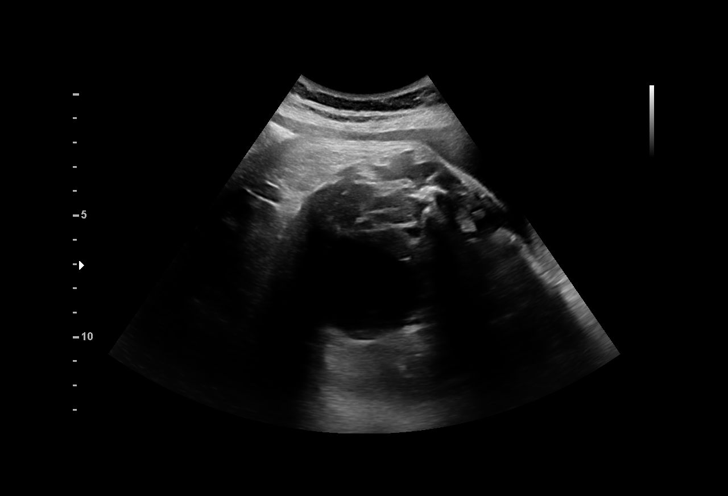
[im 6/36]
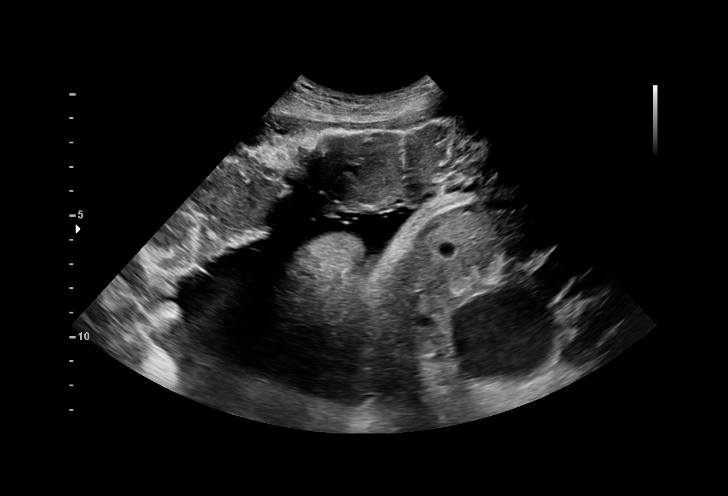
[im 8/36]
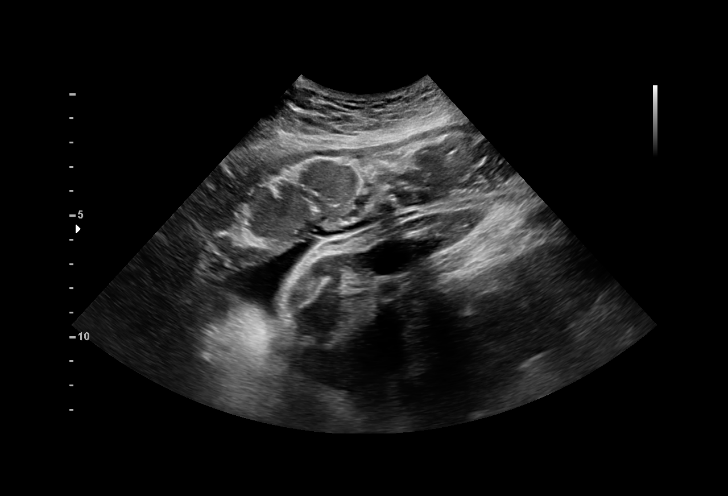
[im 11/36]
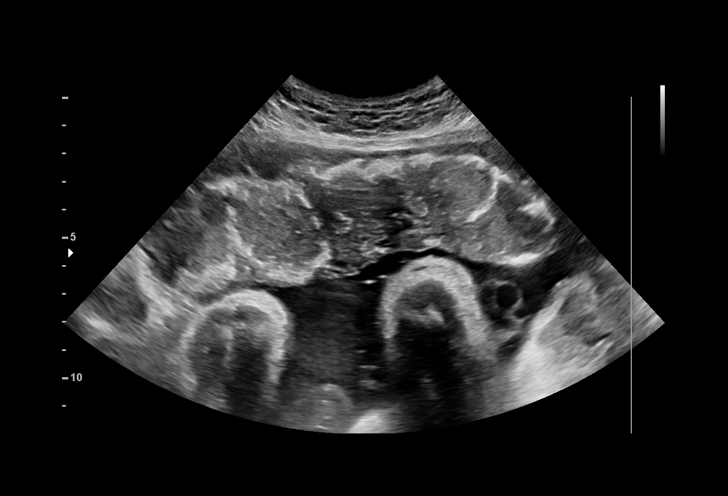
[im 13/36]
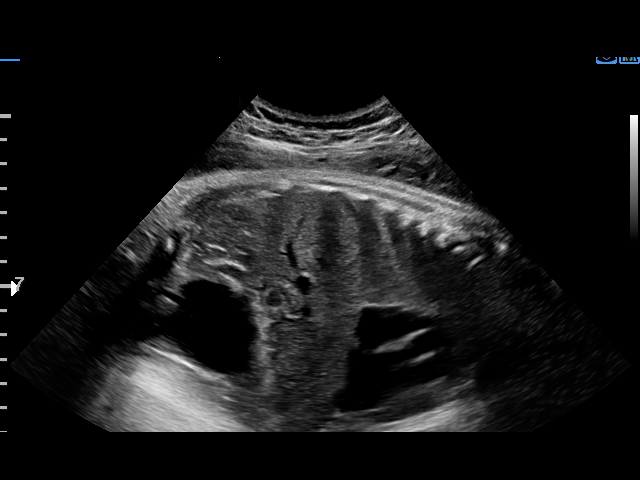
[im 16/36]
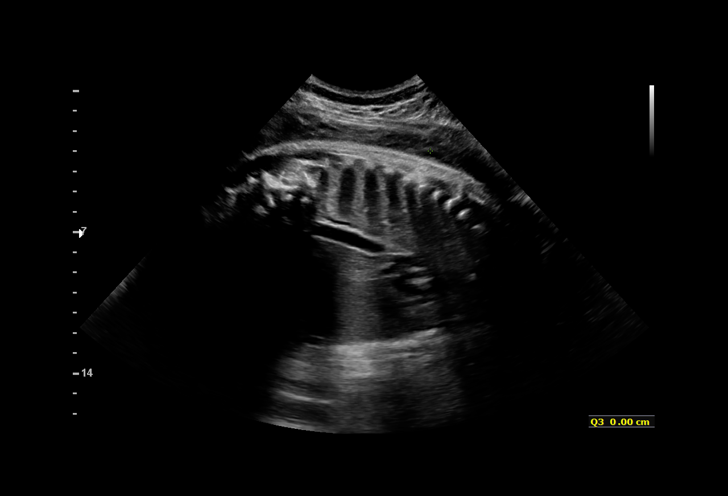
[im 19/36]
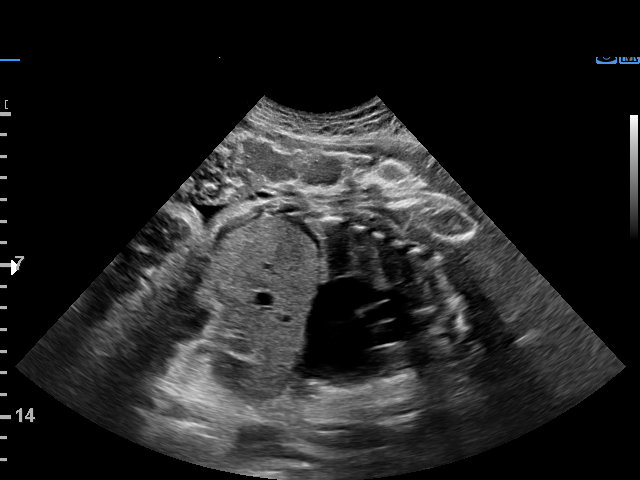
[im 20/36]
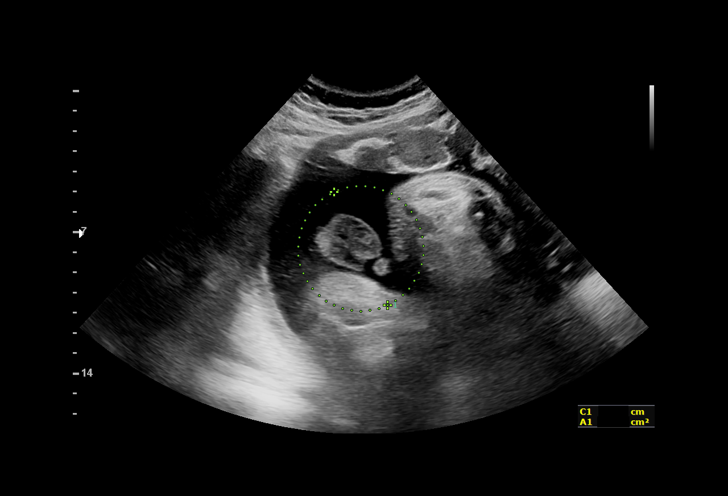
[im 23/36]
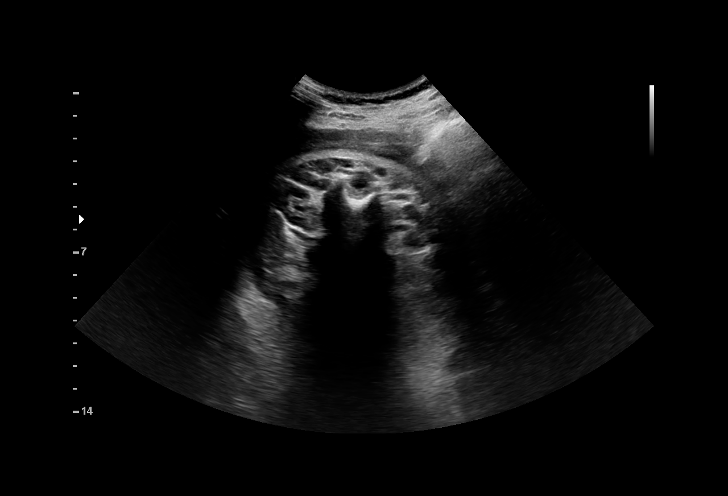
[im 25/36]
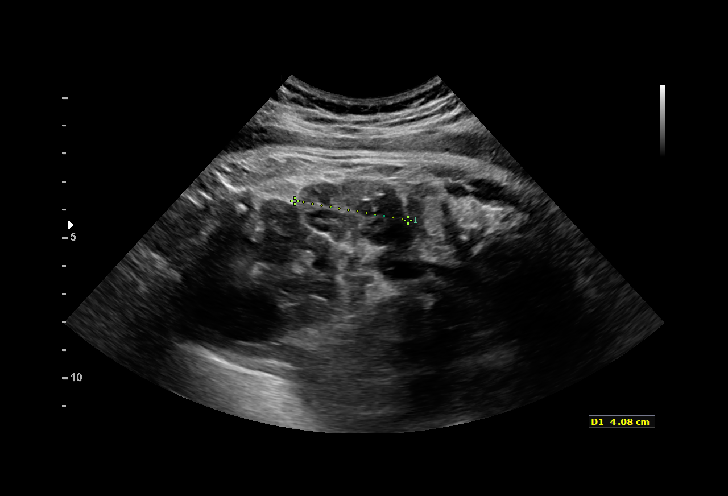
[im 28/36]
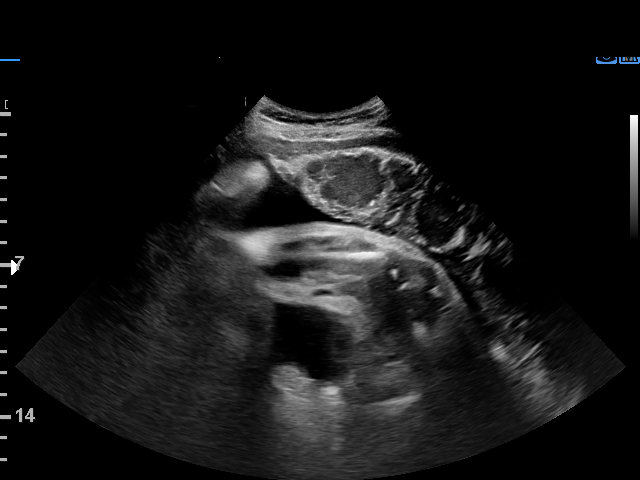
[im 30/36]
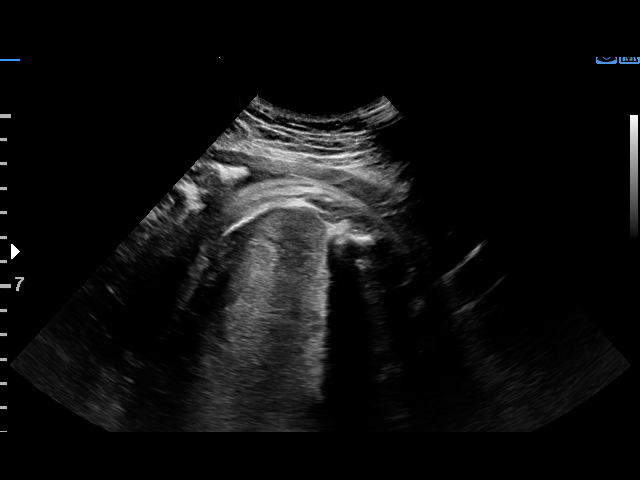
[im 33/36]
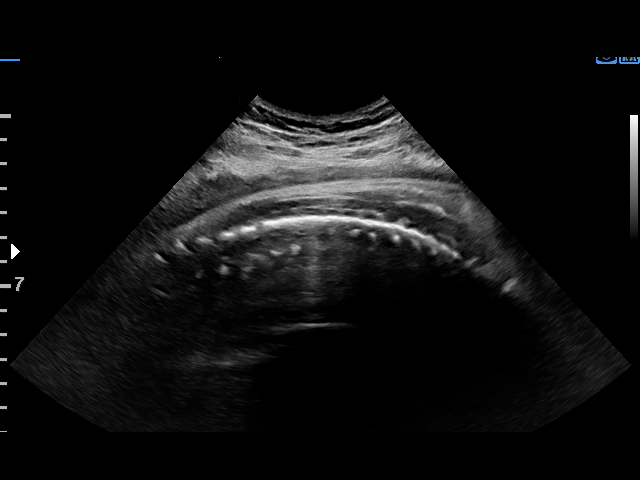
[im 36/36]
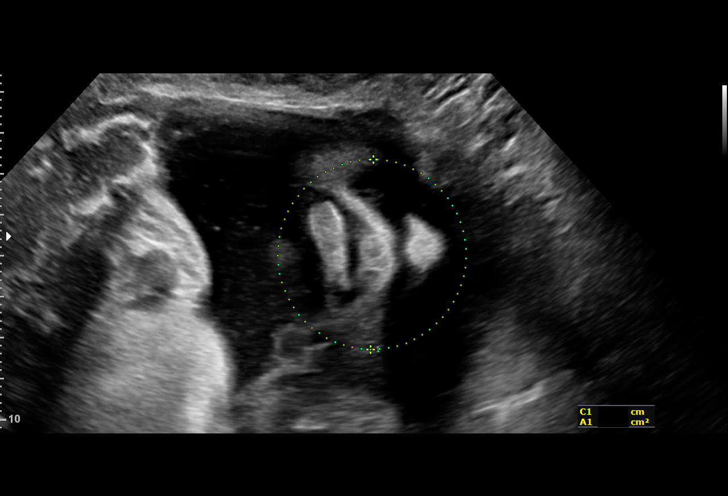

[15 of 28 positions shown; findings below may reference images not displayed]

FINDINGS: 1. Single intrauterine pregnancy.
2. Anterior placenta without evidence of previa.
3. Normal amniotic fluid index.
4. The limited anatomy survey is normal.
5. Normal biophysical profile of [DATE].
Recommendations

1. BPP [DATE].
2. Recommend fetal Jerun Gwen.
3. Management per primary OB.

## 2018-08-20 ENCOUNTER — Telehealth (HOSPITAL_COMMUNITY): Payer: Self-pay | Admitting: *Deleted

## 2018-08-20 NOTE — Telephone Encounter (Signed)
Called patient with Spanish interpreter Norma Hardin. Explained to patient that her Pap smear was abnormal. Patient states she was previously notified. Gave patient follow-up appointment at the Center for [facility] on Wednesday, September 26, 2018 at [place]. Patient verbalized understanding.

## 2018-09-26 ENCOUNTER — Encounter: Payer: Self-pay | Admitting: Obstetrics & Gynecology

## 2018-09-26 ENCOUNTER — Ambulatory Visit: Payer: No Typology Code available for payment source | Admitting: Obstetrics & Gynecology

## 2018-10-15 ENCOUNTER — Telehealth (HOSPITAL_COMMUNITY): Payer: Self-pay | Admitting: *Deleted

## 2018-10-15 NOTE — Telephone Encounter (Signed)
Called patient with Spanish interpreter Norma Hardin due to patient did not keep colposcopy appointment at the [facility] for [facility]. Rescheduled patients colposcopy appointment for 11/15/2018 and gave patient appointment. Patient verbalized understanding.

## 2018-11-14 ENCOUNTER — Telehealth: Payer: Self-pay | Admitting: Women's Health

## 2018-11-14 NOTE — Telephone Encounter (Signed)
Spanish interpreter Eda attempted to call patient about her appointment on 10/1 @ 8:15. No answer, Eda left a voicemail instructing patient to wear a face mask for the entire appointment and no visitors are allowed. Patient instructed not to attend the appointment if she has any symptoms. Symptom list and office number left.

## 2018-11-15 ENCOUNTER — Ambulatory Visit: Payer: No Typology Code available for payment source | Admitting: Women's Health

## 2018-11-15 ENCOUNTER — Encounter: Payer: Self-pay | Admitting: Family Medicine

## 2018-11-29 ENCOUNTER — Telehealth: Payer: Self-pay

## 2018-11-29 NOTE — Telephone Encounter (Signed)
Called patient with [place] interpreter LVM advising of colposcopy missed appointments x2 and the importance of her calling us back to have that rescheduled.

## 2018-12-05 ENCOUNTER — Inpatient Hospital Stay (HOSPITAL_COMMUNITY): Payer: No Typology Code available for payment source

## 2018-12-05 ENCOUNTER — Encounter (HOSPITAL_COMMUNITY): Payer: Self-pay | Admitting: *Deleted

## 2018-12-05 ENCOUNTER — Other Ambulatory Visit: Payer: Self-pay

## 2018-12-05 ENCOUNTER — Inpatient Hospital Stay (HOSPITAL_COMMUNITY)
Admission: AD | Admit: 2018-12-05 | Discharge: 2018-12-05 | Disposition: A | Discharge status: Home / Self Care | Payer: No Typology Code available for payment source | Attending: Obstetrics & Gynecology | Admitting: Obstetrics & Gynecology

## 2018-12-05 DIAGNOSIS — O209 Hemorrhage in early pregnancy, unspecified: Secondary | ICD-10-CM | POA: Insufficient documentation

## 2018-12-05 DIAGNOSIS — Z3A01 Less than 8 weeks gestation of pregnancy: Secondary | ICD-10-CM

## 2018-12-05 DIAGNOSIS — Z3490 Encounter for supervision of normal pregnancy, unspecified, unspecified trimester: Secondary | ICD-10-CM

## 2018-12-05 DIAGNOSIS — Z789 Other specified health status: Secondary | ICD-10-CM

## 2018-12-05 DIAGNOSIS — R109 Unspecified abdominal pain: Secondary | ICD-10-CM | POA: Insufficient documentation

## 2018-12-05 DIAGNOSIS — Z3A08 8 weeks gestation of pregnancy: Secondary | ICD-10-CM | POA: Insufficient documentation

## 2018-12-05 DIAGNOSIS — Z603 Acculturation difficulty: Secondary | ICD-10-CM

## 2018-12-05 DIAGNOSIS — O26891 Other specified pregnancy related conditions, first trimester: Secondary | ICD-10-CM | POA: Insufficient documentation

## 2018-12-05 LAB — URINALYSIS, ROUTINE W REFLEX MICROSCOPIC
Bilirubin Urine: NEGATIVE
Glucose, UA: NEGATIVE mg/dL
Hgb urine dipstick: NEGATIVE
Ketones, ur: NEGATIVE mg/dL
Leukocytes,Ua: NEGATIVE
Nitrite: NEGATIVE
Protein, ur: NEGATIVE mg/dL
Specific Gravity, Urine: 1.009 (ref 1.005–1.030)
pH: 6 (ref 5.0–8.0)

## 2018-12-05 LAB — CBC
HCT: 35.4 % — ABNORMAL LOW (ref 36.0–46.0)
Hemoglobin: 12 g/dL (ref 12.0–15.0)
MCH: 31.7 pg (ref 26.0–34.0)
MCHC: 33.9 g/dL (ref 30.0–36.0)
MCV: 93.7 fL (ref 80.0–100.0)
Platelets: 435 10*3/uL — ABNORMAL HIGH (ref 150–400)
RBC: 3.78 MIL/uL — ABNORMAL LOW (ref 3.87–5.11)
RDW: 11.6 % (ref 11.5–15.5)
WBC: 12.3 10*3/uL — ABNORMAL HIGH (ref 4.0–10.5)
nRBC: 0 % (ref 0.0–0.2)

## 2018-12-05 LAB — WET PREP, GENITAL
Clue Cells Wet Prep HPF POC: NONE SEEN
Sperm: NONE SEEN
Trich, Wet Prep: NONE SEEN
Yeast Wet Prep HPF POC: NONE SEEN

## 2018-12-05 LAB — POCT PREGNANCY, URINE: Preg Test, Ur: POSITIVE — AB

## 2018-12-05 LAB — HCG, QUANTITATIVE, PREGNANCY: hCG, Beta Chain, Quant, S: 75666 m[IU]/mL — ABNORMAL HIGH (ref <5)

## 2018-12-05 MED ORDER — PREPLUS 27-1 MG PO TABS: 1.0000 | ORAL_TABLET | Freq: Every day | ORAL | 13 refills | Status: DC

## 2018-12-05 NOTE — Discharge Instructions (Signed)
Plan de alimentacin para mujeres embarazadas Eating Plan for Pregnant Women Durante el [place], el cuerpo requiere nutricin adicional para brindar sustento al beb que est creciendo. Adems, usted tiene ms necesidad de recibir algunas vitaminas y Mina, como cido flico, calcio, hierro y vitamina D. Una dieta saludable y bien equilibrada es muy importante para su salud y la de su beb. Su necesidad de recibir caloras extra vara para los tres segmentos de 3 meses de su embarazo (trimestres). Para la mayora de las mujeres se recomienda consumir lo siguiente:  150caloras extra por Consulting civil engineer trimestre.  300caloras extra por [employer] trimestre.  300caloras extra por Engineer, materials trimestre. Cules son algunos consejos para seguir este plan?   No trate de Horticulturist, commercial o hacer una dieta durante el embarazo.  Limite el consumo general de alimentos con caloras vacas. Estos son alimentos con Estate manager/land agent nutritivo como los [place], postres, caramelos, [place] endulzadas con azcar.  Consuma una variedad de alimentos (en especial frutas y verduras) para obtener una gama completa de vitaminas y minerales.  Tome una vitamina prenatal para ayudar a Engineer, water las necesidades adicionales de vitaminas y Health and safety inspector, especficamente de cido flico, hierro, calcio y vitamina D.  Haga actividad fsica. Consulte al mdico qu tipos de ejercicios y actividades son seguros para usted.  Cuide la salubridad y la higiene en relacin con los alimentos. Lvese las manos antes de comer y despus de preparar carne cruda. Lave bien todas las frutas y verduras antes de pelarlas o comerlas. Tomar estas medidas puede ayudar a evitar las enfermedades de origen alimentario que pueden ser muy peligrosas para el beb, como la listeriosis. Pdale al mdico ms informacin sobre esta enfermedad. Opciones con 150 caloras extra Las opciones saludables con  150caloras extra diarias podran ser cualquiera de las siguientes:  6 a 8onzas (170 a 230g) de yogur natural con bajo contenido de grasa con taza de frutos rojos.  60manzana con 2cucharaditas (11 g) de mantequilla de man.  Verduras cortadas en trozos con de taza (60 g) de hummus.  8onzas (246ml) o 1taza de [place] chocolatada con bajo contenido de McCalla.  1tira de queso en hebras con 1naranja mediana.  1emparedado de [place] de man y [place] con [place] de pan integral y Insurance underwriter (5 g) de [place] de man. Para obtener 300caloras extra, podra comer dos de esas opciones saludables por da. Cul es la cantidad saludable de peso que se debe aumentar? La cantidad [place] para aumentar de peso se basa en su ndice de masa corporal ([place]) antes del embarazo. Si su IMC:  Era menos de 18 (baja de peso), debe aumentar de 28 a 40 libras (13 a 18kg).  Era de 4 a 24,9 (normal), debe aumentar de 25 a 35libras (11 a 16kg).  Era de 25 a 29,9 (sobrepeso), debe aumentar de 15 a 25libras (7 a 11kg).  Era de 30 o ms (obesa), debe aumentar de 11 a 20libras (5 a 9kg). Qu sucede si estoy embarazada de gemelos o ms bebs? Por lo general, si est embarazada de gemelos o ms bebs:  Es posible que deba ingerir entre 300 y 64 caloras extra por Training and development officer.  El rango de aumento de peso total recomendado es de 25 a 54libras (11 a 25kg), en funcin del [place] antes del embarazo.  Hable con el mdico para averiguar [employer] necesidades nutricionales, aumento de [place] y ejercicios adecuados para usted. Qu alimentos puedo comer?  Cereales [employer]  los cereales. Elija cereales integrales, como el pan integral, la avena o el arroz integral. Verduras Todas las verduras. Consuma verduras de colores y tipos variados. Recuerde lavar bien las verduras antes de pelarlas o comerlas. Frutas Todas las frutas. Consuma frutas de colores y tipos variados. Recuerde lavar bien las  frutas antes de pelarlas o comerlas. Carnes y otros alimentos ricos en protenas Carnes Auroramagras, entre ellas, [address] Tuttle Drpollo, Corinnepavo, pescado y cortes Evelynshiremagros de carne de Erin Springsvaca, Belizeternera o cerdo. Si come pescado o mariscos, elija opciones que tengan un alto contenido de cidos grasos omega-3 y bajo contenido de mercurio, como el salmn, el arenque, los Las Maravillasmejillones, la Rock Springstrucha, las sardinas, el abadejo, el Castle Pointcamarn, Oregonel cangrejo y Astronomerla langosta. Tofu. Tempeh. Frijoles. Huevos. Mantequilla de man y Special educational needs teacherotras mantequillas de frutos secos. Asegrese de que todas las carnes, aves y Terrytownhuevos estn cocidas a temperaturas seguras para los alimentos o bien cocidas. Se recomiendan dos o ms porciones de pescado por semana para obtener la mayor cantidad de beneficios de los cidos grasos omega-3 que se Programme researcher, broadcasting/film/videoencuentran en los mariscos. Elija pescados con bajo contenido de mercurio. Puede obtener ms informacin en lnea:  PumpkinSearch.com.eewww.fda.gov Lcteos Leche y derivados de la [address]leche pasteurizados (como la Independenceleche de Eddingtonalmendra). Yogur y queso pasteurizados. Requesn. Tami Ribasrema agria. CHS IncBebidas Agua. Jugos que contengan jugo de frutas o de verduras al 100%. Ts sin cafena y caf descafeinado. Se pueden tomar bebidas que contengan cafena, pero es mejor evitar esta sustancia. Mantenga la ingesta total de cafena en menos de 200mg  diarios (que equivale a 12onzas o 355ml de caf, t o refresco) o al UnitedHealthlmite que le haya indicado el mdico. Grasas y [address] Las grasas y Therapist, occupationalaceites se pueden incluir de forma moderada. Dulces y Lear Corporationpostres Los dulces y postres se pueden incluir de forma moderada. Condimentos y otros The Timken Companyalimentos Todos los condimentos pasteurizados. Es posible que los productos mencionados arriba no formen una lista completa de las bebidas o los alimentos recomendados. Comunquese con el nutricionista para conocer ms opciones. Qu alimentos no se recomiendan? Verduras Jugos de verduras crudas (no pasteurizadas). Frutas Jugos de frutas no  pasteurizados. Carnes y otros alimentos ricos en protenas Embutidos, salchichn, hot dogs u otros fiambres. (Si debe consumir esas carnes, vuelva a calentarlas hasta que emanen humo caliente). Pats refrigerados, pats de carne de una carnicera, mariscos ahumados que se encuentran en la seccin de productos refrigerados de Grenorauna tienda. Carnes, aves y huevos crudos o mal cocidos. Pescado crudo, como sushi o sashimi. Pescados que tengan alto contenido de mercurio, como Russiavilleazulejo, tiburn, pez espada y caballa. Para obtener ms informacin sobre la presencia del mercurio en el pescado, consulte al mdico o busque recursos en lnea, como:  PumpkinSearch.com.eewww.fda.gov Lcteos Leche cruda (no pasteurizada) y cualquier alimento que contenga Elk Creekleche cruda. Quesos blandos, como feta, queso Maytownblanco, Mendotaqueso fresco, MaysvilleBrie, quesos Yadkin Collegeamembert, quesos azules y Aeronautical engineerqueso panela (salvo que estn elaborados con leche pasteurizada, lo cual debe constar en la etiqueta). Bebidas Alcohol. Bebidas endulzadas con azcar, como refrescos, ts o bebidas energizantes. Condimentos y otros Bank of New York Companyalimentos Alimentos y bebidas caseros, como pickles, chucrut o bebidas con kombucha. (Se pueden consumir las versiones que estn pasteurizadas y se vendan en las tiendas). Ensaladas preparadas en una tienda o charcutera, como ensalada de Barneyjamn, de Coalmontpollo, de Long Grovehuevo, de atn y de Oceanographermariscos. Es posible que los productos que se enumeran ms arriba no sean una lista completa de los alimentos y las bebidas que se Theatre stage managerdeben evitar. Comunquese con el nutricionista para obtener ms informacin. Dnde encontrar  ms informacin Para calcular la cantidad de caloras que necesita segn su altura, peso y [place] de [place], [place] utilizar una calculadora en lnea, como:  PackageNews.is Para calcular cunto debe aumentar de peso durante el [place], puede utilizar una calculadora de aumento de peso durante el embarazo en lnea,  como:  http://jones-berg.com/ Resumen  Durante el [place], [place] cuerpo requiere nutricin adicional para brindar sustento al beb que est creciendo.  Consuma una variedad de alimentos, en especial frutas y verduras, para obtener una gama completa de vitaminas y minerales.  Cuide la salubridad y la higiene en relacin con los alimentos. Lvese las manos antes de comer y despus de preparar carne cruda. Lave bien todas las frutas y verduras antes de pelarlas o comerlas. Tomar estas medidas puede ayudar a USAA de origen alimentario, como la listeriosis, que pueden ser muy peligrosas para el beb.  No consuma carne ni pescado crudos. No consuma pescados que tengan [address] de mercurio, como [place], tiburn, pez espada y caballa. No consuma productos lcteos no pasteurizados (crudos).  Tome una vitamina prenatal para ayudar a Patent examiner las necesidades adicionales de vitaminas y Engineer, drilling, especficamente de cido flico, hierro, calcio y vitamina D. Esta informacin no tiene Theme park manager el consejo del mdico. Asegrese de hacerle al mdico cualquier pregunta que tenga. Document Released: 11/15/2013 Document Revised: 01/17/2017 Document Reviewed: 01/17/2017 Elsevier Patient Education  2020 ArvinMeritor.   Prenatal Care Providers           [facility] @ [facility]   Phone: [phone]  Center for [facility] @ [facility]   Phone: [phone]  [facility] @[facility]       Phone: [phone]            [facility] for [facility] @ [place]     Phone: [phone]          Center for [facility] @ [facility]   Phone: [phone]  Center for [facility] @ [facility]  Phone: [phone]  [facility] for [facility] @ [facility] Phone: [phone]     [facility]  Phone: [phone]  [facility]  Phone:  [phone]  [phone] OB/GYN Phone: [phone]  [facility] Phone: [phone]  [facility] Phone: [phone]  [facility] Phone: [phone]  [facility]  Phone: [phone]

## 2018-12-05 NOTE — MAU Note (Signed)
Is preg, started bleeding yesterday has stopped. About one pad, not saturated, not clots. Having mild like menstrual cramps. Hx of 2 losses.  +HPT about 3 wks ago.

## 2018-12-05 NOTE — MAU Provider Note (Signed)
History     CSN: [id number]  Arrival date and time: 12/05/18 1328   First Provider Initiated Contact with Patient 12/05/18 1621      Chief Complaint  Patient presents with  . Abdominal Pain  . Vaginal Bleeding   HPI Norma Hardin is a 23 y.o. [phone] at 5458w1d by certain LMP who presents to MAU with chief complaint of vaginal bleeding in the setting of positive home pregnancy test. This is a new problem, onset yesterday. Patient endorses seeing blood on a single pad, no clots. She also endorses abdominal cramping "like my period", onset coinciding with bleeding yesterday. Her pain is suprapubic and does not radiate. She rates it as 3/10. She denies aggravating or alleviating factors. She has not taken medication or tried other treatments for this complaint. She denies abdominal tenderness, ongoing vaginal bleeding, dysuria, dyspareunia, fever or recent illness.   OB History    Gravida  4   Para  1   Term  1   Preterm      AB  2   Living  1     SAB  2   TAB      Ectopic      Multiple  0   Live Births  1           Past Medical History:  Diagnosis Date  . Depression   . Drug abuse (HCC)    Cocaine, ETOH  . Medical history non-contributory     Past Surgical History:  Procedure Laterality Date  . NO PAST SURGERIES      Family History  Problem Relation Age of Onset  . Hypertension Maternal Grandmother   . Hypertension Maternal Grandfather     Social History   Tobacco Use  . Smoking status: Never Smoker  . Smokeless tobacco: Never Used  . Tobacco comment: Hooka  Substance Use Topics  . Alcohol use: Yes    Alcohol/week: 1.0 standard drinks    Types: 1 Cans of beer per week    Comment: occassionally  . Drug use: No    Types: Cocaine    Comment: Patient quit 3 months ago. Has taken several times.    NONE SINCE 6 MTHS    Allergies: No Known Allergies  Medications Prior to Admission  Medication Sig Dispense Refill Last Dose  .  cephALEXin (KEFLEX) 500 MG capsule Take 1 capsule (500 mg total) by mouth 3 (three) times daily. 2 caps po bid x 7 days (Patient not taking: Reported on 06/26/2018) 28 capsule 0   . HYDROcodone-acetaminophen (NORCO/VICODIN) 5-325 MG tablet Take 1 tablet by mouth every 6 (six) hours as needed for moderate pain or severe pain. (Patient not taking: Reported on 06/26/2018) 12 tablet 0   . ibuprofen (ADVIL,MOTRIN) 600 MG tablet Take 1 tablet (600 mg total) by mouth every 6 (six) hours as needed. (Patient not taking: Reported on 06/26/2018) 30 tablet 0   . Prenatal Vit-Fe Fumarate-FA (PRENATAL MULTIVITAMIN) TABS tablet Take 1 tablet by mouth daily at 12 noon.       Review of Systems  Constitutional: Negative for chills, fatigue and fever.  Respiratory: Negative for shortness of breath.   Gastrointestinal: Positive for abdominal pain.  Genitourinary: Positive for vaginal bleeding. Negative for difficulty urinating and dysuria.  Musculoskeletal: Negative for back pain.  Neurological: Negative for headaches.  All other systems reviewed and are negative.  Physical Exam   Blood pressure 114/71, pulse 98, temperature 98.9 F (37.2 C), temperature source Oral,  resp. rate 16, weight 53.7 kg, last menstrual period 10/09/2018, SpO2 100 %, currently breastfeeding.  Physical Exam  Nursing note and vitals reviewed. Constitutional: She is oriented to person, place, and time. She appears well-developed and well-nourished.  Cardiovascular: Normal rate.  Genitourinary:    Vagina normal.     No vaginal discharge.     Genitourinary Comments: No injury, bleeding or abnormal discharge noted on SSE. Bimanual exam not performed   Neurological: She is alert and oriented to person, place, and time.  Skin: Skin is warm and dry.  Psychiatric: She has a normal mood and affect. Her behavior is normal. Judgment and thought content normal.    MAU Course/MDM  Procedures: sterile speculum exam, ultrasound  Patient Vitals  for the past 24 hrs:  BP Temp Temp src Pulse Resp SpO2 Weight  12/05/18 1735 103/68 - - 80 - - -  12/05/18 1345 114/71 98.9 F (37.2 C) Oral 98 16 100 % 53.7 kg   Results for orders placed or performed during the hospital encounter of 12/05/18 (from the past 24 hour(s))  Pregnancy, urine POC     Status: Abnormal   Collection Time: 12/05/18  2:05 PM  Result Value Ref Range   Preg Test, Ur POSITIVE (A) NEGATIVE  Urinalysis, Routine w reflex microscopic     Status: None   Collection Time: 12/05/18  2:11 PM  Result Value Ref Range   Color, Urine YELLOW YELLOW   APPearance CLEAR CLEAR   Specific Gravity, Urine 1.009 1.005 - 1.030   pH 6.0 5.0 - 8.0   Glucose, UA NEGATIVE NEGATIVE mg/dL   Hgb urine dipstick NEGATIVE NEGATIVE   Bilirubin Urine NEGATIVE NEGATIVE   Ketones, ur NEGATIVE NEGATIVE mg/dL   Protein, ur NEGATIVE NEGATIVE mg/dL   Nitrite NEGATIVE NEGATIVE   Leukocytes,Ua NEGATIVE NEGATIVE  CBC     Status: Abnormal   Collection Time: 12/05/18  3:49 PM  Result Value Ref Range   WBC 12.3 (H) 4.0 - 10.5 K/uL   RBC 3.78 (L) 3.87 - 5.11 MIL/uL   Hemoglobin 12.0 12.0 - 15.0 g/dL   HCT 35.4 (L) 36.0 - 46.0 %   MCV 93.7 80.0 - 100.0 fL   MCH 31.7 26.0 - 34.0 pg   MCHC 33.9 30.0 - 36.0 g/dL   RDW 11.6 11.5 - 15.5 %   Platelets 435 (H) 150 - 400 K/uL   nRBC 0.0 0.0 - 0.2 %  hCG, quantitative, pregnancy     Status: Abnormal   Collection Time: 12/05/18  3:49 PM  Result Value Ref Range   hCG, Beta Chain, Quant, S 75,666 (H) <5 mIU/mL  Wet prep, genital     Status: Abnormal   Collection Time: 12/05/18  4:39 PM   Specimen: PATH Cytology Cervicovaginal Ancillary Only  Result Value Ref Range   Yeast Wet Prep HPF POC NONE SEEN NONE SEEN   Trich, Wet Prep NONE SEEN NONE SEEN   Clue Cells Wet Prep HPF POC NONE SEEN NONE SEEN   WBC, Wet Prep HPF POC MANY (A) NONE SEEN   Sperm NONE SEEN    US Ob Less Than 14 Weeks With Ob Transvaginal  Result Date: 12/05/2018 CLINICAL DATA:  Vaginal  bleeding in 1st trimester pregnancy. EXAM: OBSTETRIC <14 WK [place] AND TRANSVAGINAL OB US TECHNIQUE: Both transabdominal and transvaginal ultrasound examinations were performed for complete evaluation of the gestation as well as the maternal uterus, adnexal regions, and pelvic cul-de-sac. Transvaginal technique was performed to assess early pregnancy.  COMPARISON:  None. FINDINGS: Intrauterine gestational sac: Single Yolk sac:  Visualized. Embryo:  Visualized. Cardiac Activity: Visualized. Heart Rate: 133 bpm CRL:  9 mm   6 w   5 d                  [place] EDC: 07/26/2019 Subchorionic hemorrhage:  None visualized. Maternal uterus/adnexae: Retroflexed uterus noted. Normal appearance of both ovaries. No mass or abnormal free fluid identified. IMPRESSION: Single living IUP measuring 6 weeks 5 days, with [place] EDC of 07/26/2019. Retroflexed maternal uterus. Electronically Signed   By: Danae Orleans M.D.   On: 12/05/2018 17:12   Meds ordered this encounter  Medications  . Prenatal Vit-Fe Fumarate-FA (PREPLUS) 27-1 MG TABS    Sig: Take 1 tablet by mouth daily.    Dispense:  30 tablet    Refill:  13    Order Specific Question:   Supervising Provider    Answer:   Jaynie Collins A [[id number]]    Assessment and Plan  --23 y.o. [phone] at [redacted]w[redacted]d by US performed today --Rx prenatal vitamin to pharmacy --Reviewed concerning signs and typical complaints in first trimester --Pt given list of [facility] Providers in [place] --Discharge home in stable condition  F/U: --Patient to establish [facility] at 11-13 weeks  Language barrier: Interpreter Viria present for all patient interaction  Calvert Cantor, CNM 12/05/2018, 6:11 PM

## 2018-12-07 LAB — GC/CHLAMYDIA PROBE AMP (~~LOC~~) NOT AT ARMC
Chlamydia: NEGATIVE
Comment: NEGATIVE
Comment: NORMAL
Neisseria Gonorrhea: NEGATIVE

## 2018-12-31 ENCOUNTER — Encounter: Payer: Self-pay | Admitting: Family Medicine

## 2018-12-31 ENCOUNTER — Ambulatory Visit: Payer: No Typology Code available for payment source | Admitting: Family Medicine

## 2018-12-31 ENCOUNTER — Other Ambulatory Visit: Payer: No Typology Code available for payment source

## 2019-01-09 ENCOUNTER — Encounter: Payer: Self-pay | Admitting: Lactation Services

## 2019-01-09 NOTE — Progress Notes (Signed)
Emailed Etheleen Sia, RN at [facility] [employer] to inform her pt [facility] x 2 and did not return call to get rescheduled.

## 2019-01-25 ENCOUNTER — Other Ambulatory Visit (HOSPITAL_COMMUNITY): Payer: Self-pay | Admitting: Obstetrics and Gynecology

## 2019-01-25 ENCOUNTER — Other Ambulatory Visit: Payer: Self-pay

## 2019-01-25 ENCOUNTER — Ambulatory Visit
Admission: RE | Admit: 2019-01-25 | Discharge: 2019-01-25 | Disposition: A | Discharge status: Home / Self Care | Payer: No Typology Code available for payment source | Source: Ambulatory Visit | Attending: Obstetrics and Gynecology | Admitting: Obstetrics and Gynecology

## 2019-01-25 DIAGNOSIS — N6320 Unspecified lump in the left breast, unspecified quadrant: Secondary | ICD-10-CM

## 2019-02-15 NOTE — L&D Delivery Note (Signed)
OB/GYN Faculty Practice Delivery Note  [name] Norma Hardin is a 24 y.o. [phone] s/p VD at [redacted]w[redacted]d. She was admitted for elective IOL at term due to social reasons.   ROM: 0h 43m with clear fluid GBS Status: Negative/-- (05/17 0000) Maximum Maternal Temperature: 98.37F  Labor Progress: . Initial SVE: 5/90/-1. Patient received Pitocin and AROM. She then progressed to complete.   Delivery Date/Time: 6/14 @ 2149 Delivery: Called to room and patient was complete and pushing. Head delivered in LOA position. No nuchal cord present. Shoulder and body delivered in usual fashion. Infant with spontaneous cry, placed on mother's abdomen, dried and stimulated. Cord clamped x 2 after 1-minute delay, and cut by RN. Cord blood drawn. Placenta delivered spontaneously with gentle cord traction. Fundus firm with massage and Pitocin. Labia, perineum, vagina, and cervix inspected inspected with first degree perineal laceration which was repaired with 3-0 Vicryl in a standard fashion.  Baby Weight: pending  Placenta: Sent to L&D Complications: None Lacerations: 1st degree perineal EBL: 282 mL Analgesia: Local lidocaine for repair   Infant:  APGAR (1 MIN): 8   APGAR (5 MINS): 9   APGAR (10 MINS):     Jerilynn Birkenhead, MD [facility] Family Medicine Fellow, [facility] for [facility], [facility] 07/29/2019, 10:33 PM

## 2019-07-01 LAB — OB RESULTS CONSOLE GBS: GBS: NEGATIVE

## 2019-07-29 ENCOUNTER — Inpatient Hospital Stay (HOSPITAL_COMMUNITY)
Admission: RE | Admit: 2019-07-29 | Discharge: 2019-07-31 | DRG: 805 | Disposition: A | Discharge status: Home / Self Care | Payer: Medicaid Other | Attending: Obstetrics & Gynecology | Admitting: Obstetrics & Gynecology

## 2019-07-29 ENCOUNTER — Other Ambulatory Visit: Payer: Self-pay

## 2019-07-29 ENCOUNTER — Telehealth: Payer: Self-pay

## 2019-07-29 ENCOUNTER — Encounter (HOSPITAL_COMMUNITY): Payer: Self-pay | Admitting: Obstetrics & Gynecology

## 2019-07-29 DIAGNOSIS — O9853 Other viral diseases complicating the puerperium: Secondary | ICD-10-CM | POA: Diagnosis not present

## 2019-07-29 DIAGNOSIS — U071 COVID-19: Secondary | ICD-10-CM

## 2019-07-29 DIAGNOSIS — O9852 Other viral diseases complicating childbirth: Secondary | ICD-10-CM | POA: Diagnosis present

## 2019-07-29 DIAGNOSIS — O98513 Other viral diseases complicating pregnancy, third trimester: Secondary | ICD-10-CM

## 2019-07-29 DIAGNOSIS — Z3A40 40 weeks gestation of pregnancy: Secondary | ICD-10-CM

## 2019-07-29 DIAGNOSIS — Z3490 Encounter for supervision of normal pregnancy, unspecified, unspecified trimester: Secondary | ICD-10-CM | POA: Diagnosis present

## 2019-07-29 DIAGNOSIS — O26893 Other specified pregnancy related conditions, third trimester: Secondary | ICD-10-CM | POA: Diagnosis present

## 2019-07-29 LAB — CBC
HCT: 37.3 % (ref 36.0–46.0)
Hemoglobin: 12.7 g/dL (ref 12.0–15.0)
MCH: 32.7 pg (ref 26.0–34.0)
MCHC: 34 g/dL (ref 30.0–36.0)
MCV: 96.1 fL (ref 80.0–100.0)
Platelets: 334 10*3/uL (ref 150–400)
RBC: 3.88 MIL/uL (ref 3.87–5.11)
RDW: 13.5 % (ref 11.5–15.5)
WBC: 9.6 10*3/uL (ref 4.0–10.5)
nRBC: 0 % (ref 0.0–0.2)

## 2019-07-29 LAB — TYPE AND SCREEN
ABO/RH(D): A POS
Antibody Screen: NEGATIVE

## 2019-07-29 LAB — SARS CORONAVIRUS 2 BY RT PCR (HOSPITAL ORDER, PERFORMED IN ~~LOC~~ HOSPITAL LAB): SARS Coronavirus 2: POSITIVE — AB

## 2019-07-29 LAB — ABO/RH: ABO/RH(D): A POS

## 2019-07-29 MED ORDER — LIDOCAINE HCL (PF) 1 % IJ SOLN
30.0000 mL | INTRAMUSCULAR | Status: DC | PRN
  Administered 2019-07-29: 30 mL via SUBCUTANEOUS
  Filled 2019-07-29: qty 30

## 2019-07-29 MED ORDER — FENTANYL CITRATE (PF) 100 MCG/2ML IJ SOLN
100.0000 ug | INTRAMUSCULAR | Status: DC | PRN
  Administered 2019-07-29: 100 ug via INTRAVENOUS
  Filled 2019-07-29: qty 2

## 2019-07-29 MED ORDER — SOD CITRATE-CITRIC ACID 500-334 MG/5ML PO SOLN: 30.0000 mL | ORAL | Status: DC | PRN

## 2019-07-29 MED ORDER — OXYTOCIN BOLUS FROM INFUSION
500.0000 mL | Freq: Once | INTRAVENOUS | Status: AC
  Administered 2019-07-29: 500 mL via INTRAVENOUS

## 2019-07-29 MED ORDER — TERBUTALINE SULFATE 1 MG/ML IJ SOLN: 0.2500 mg | Freq: Once | INTRAMUSCULAR | Status: DC | PRN

## 2019-07-29 MED ORDER — LACTATED RINGERS IV SOLN: INTRAVENOUS | Status: DC

## 2019-07-29 MED ORDER — FENTANYL CITRATE (PF) 100 MCG/2ML IJ SOLN: 50.0000 ug | INTRAMUSCULAR | Status: DC | PRN

## 2019-07-29 MED ORDER — OXYTOCIN-SODIUM CHLORIDE 30-0.9 UT/500ML-% IV SOLN
1.0000 m[IU]/min | INTRAVENOUS | Status: DC
  Administered 2019-07-29: 2 m[IU]/min via INTRAVENOUS

## 2019-07-29 MED ORDER — ONDANSETRON HCL 4 MG/2ML IJ SOLN: 4.0000 mg | Freq: Four times a day (QID) | INTRAMUSCULAR | Status: DC | PRN

## 2019-07-29 MED ORDER — OXYTOCIN-SODIUM CHLORIDE 30-0.9 UT/500ML-% IV SOLN
2.5000 [IU]/h | INTRAVENOUS | Status: DC
  Administered 2019-07-29: 2.5 [IU]/h via INTRAVENOUS
  Filled 2019-07-29 (×2): qty 500

## 2019-07-29 MED ORDER — LACTATED RINGERS IV SOLN: 500.0000 mL | INTRAVENOUS | Status: DC | PRN

## 2019-07-29 MED ORDER — ACETAMINOPHEN 325 MG PO TABS: 650.0000 mg | ORAL_TABLET | ORAL | Status: DC | PRN

## 2019-07-29 MED ORDER — OXYCODONE-ACETAMINOPHEN 5-325 MG PO TABS
1.0000 | ORAL_TABLET | ORAL | Status: DC | PRN
  Administered 2019-07-29: 1 via ORAL
  Filled 2019-07-29 (×2): qty 1

## 2019-07-29 NOTE — Progress Notes (Signed)
Labor Progress Note Selenia Lidieth [place] Rosa is a 24 y.o. [phone] at 56w3dpresented for elective IOL at term. S: Feeling ctx but overall comfortable. Met patient and discussed plan. Desires AROM now if possible.   O:  BP 115/75   Pulse 85   Temp 98.5 F (36.9 C) (Oral)   Resp 16   Ht 5' 1"  (1.549 m)   Wt 73.9 kg   LMP 10/09/2018   BMI 30.80 kg/m  EFM: 145, moderate variability, pos accels, no decels, reactive TOCO: q2-360mCVE: Dilation: 5 Effacement (%): 90 Cervical Position: Middle Station: -2 Presentation: Vertex Exam by:: Dr. FaMarice Potter A&P: [age].o. [id number] for elective IOL at term.  #Labor: Progressing well. Cont Pit. AROM with moderate amount of clear fluid at this exam. Anticipate SVD.  #Pain: per patient request  #FWB: Cat I; EFW 3700g #GBS negative #COVD positive on asymptomatic screening; patient denies symptoms now or ever; cont to monitor  CheChauncey MannD 9:18 PM

## 2019-07-29 NOTE — Telephone Encounter (Signed)
Telephoned patient at mobile number. Spanish interpreter Raynelle Fanning) spoke with adult female answering the phone. He stated patient at the hospital and call released.

## 2019-07-29 NOTE — Discharge Summary (Signed)
Postpartum Discharge Summary     Patient Name: Norma Hardin DOB: [birth year] MRN: [id number]  Date of admission: 07/29/2019 Delivery date:07/29/2019  Delivering provider: Chauncey Mann  Date of discharge: 07/31/2019  Admitting diagnosis: Encounter for induction of labor [Z34.90] Intrauterine pregnancy: [redacted]w[redacted]d    Secondary diagnosis:  Active Problems:   Encounter for induction of labor   COVID-19 affecting pregnancy in third trimester  Additional problems: None    Discharge diagnosis: Term Pregnancy Delivered                                              Post partum procedures:None Augmentation: AROM and Pitocin Complications: None  Hospital course: Induction of Labor With Vaginal Delivery   24y.o. yo [id number] admitted to the hospital 07/29/2019 for induction of labor.  Indication for induction: Elective.  Patient had an uncomplicated labor course as follows: Initial SVE: 5/90/-1. Patient received Pitocin and AROM. She then progressed to complete.  Membrane Rupture Time/Date: 9:14 PM ,07/29/2019   Delivery Method:Vaginal, Spontaneous  Episiotomy: None  Lacerations:  1st degree  Details of delivery can be found in separate delivery note. Desires Nexplanon placement at HD. Patient had a routine postpartum course. Patient is discharged home 07/31/19.  Newborn Data: Birth date:07/29/2019  Birth time:9:49 PM  Gender:Female  Living status:Living  Apgars:8 ,9  [id number]   Magnesium Sulfate received: No BMZ received: No Rhophylac:N/A MMR:N/A T-DaP:Given prenatally Flu: No Transfusion:No  Physical exam  Vitals:   07/30/19 0853 07/30/19 1300 07/30/19 2221 07/31/19 0522  BP: 103/82 103/72 99/67 120/86  Pulse: 83 81 88 65  Resp: 19 17 18 16   Temp: 98.5 F (36.9 C) 98.4 F (36.9 C) 98.2 F (36.8 C) 97.7 F (36.5 C)  TempSrc: Oral Oral  Axillary  SpO2: 98% 98% 98%   Weight:      Height:       General: alert, cooperative and no  distress Lochia: appropriate Uterine Fundus: firm Incision: N/A DVT Evaluation: No evidence of DVT seen on physical exam. Labs: Lab Results  Component Value Date   WBC 9.6 07/29/2019   HGB 12.7 07/29/2019   HCT 37.3 07/29/2019   MCV 96.1 07/29/2019   PLT 334 07/29/2019   CMP Latest Ref Rng & Units 11/17/2016  Glucose 65 - 99 mg/dL 101(H)  BUN 6 - 20 mg/dL 10  Creatinine 0.44 - 1.00 mg/dL 0.61  Sodium 135 - 145 mmol/L 139  Potassium 3.5 - 5.1 mmol/L 3.9  Chloride 101 - 111 mmol/L 106  CO2 22 - 32 mmol/L 24  Calcium 8.9 - 10.3 mg/dL 9.5  Total Protein 6.5 - 8.1 g/dL 7.6  Total Bilirubin 0.3 - 1.2 mg/dL 0.4  Alkaline Phos 38 - 126 U/L 93  AST 15 - 41 U/L 34  ALT 14 - 54 U/L 44   Edinburgh Score: Edinburgh Postnatal Depression Scale Screening Tool 07/30/2019  I have been able to laugh and see the funny side of things. 0  I have looked forward with enjoyment to things. 0  I have blamed myself unnecessarily when things went wrong. 0  I have been anxious or worried for no good reason. 2  I have felt scared or panicky for no good reason. 1  Things have been getting on top of me. 0  I have been so unhappy  that I have had difficulty sleeping. 0  I have felt sad or miserable. 0  I have been so unhappy that I have been crying. 0  The thought of harming myself has occurred to me. 0  Edinburgh Postnatal Depression Scale Total 3     After visit meds:  Allergies as of 07/31/2019   No Known Allergies     Medication List    TAKE these medications   acetaminophen 325 MG tablet Commonly known as: Tylenol Take 2 tablets (650 mg total) by mouth every 6 (six) hours as needed (for pain scale < 4).   ibuprofen 600 MG tablet Commonly known as: ADVIL Take 1 tablet (600 mg total) by mouth every 8 (eight) hours as needed for mild pain.   PrePLUS 27-1 MG Tabs Take 1 tablet by mouth daily.   senna-docusate 8.6-50 MG tablet Commonly known as: Senokot-S Take 2 tablets by mouth daily.         Discharge home in stable condition Infant Feeding: Bottle and Breast Infant Disposition:home with mother Discharge instruction: per After Visit Summary and Postpartum booklet. Activity: Advance as tolerated. Pelvic rest for 6 weeks.  Diet: routine diet Future Appointments:No future appointments. Follow up Visit:  Patient to schedule f/u at HD.   07/31/2019 Chauncey Mann, MD

## 2019-07-29 NOTE — H&P (Addendum)
LABOR AND DELIVERY ADMISSION HISTORY AND PHYSICAL NOTE  Norma Hardin is a 24 y.o. female [phone] with IUP at [redacted]w[redacted]d by LMP presenting for elective induction at term.  Has had some intermittent contractions, but not really painful or consistent.  She reports positive fetal movement. She denies leakage of fluid or vaginal bleeding.  Prenatal History/Complications: PNC at [facility] Pregnancy complications: None  Past Medical History: Past Medical History:  Diagnosis Date  . Depression   . Drug abuse (HCC)    Cocaine, ETOH  . Medical history non-contributory     Past Surgical History: Past Surgical History:  Procedure Laterality Date  . NO PAST SURGERIES      Obstetrical History: OB History    Gravida  4   Para  1   Term  1   Preterm      AB  2   Living  1     SAB  2   TAB      Ectopic      Multiple  0   Live Births  1           Social History: Social History   Socioeconomic History  . Marital status: Single    Spouse name: Not on file  . Number of children: 1  . Years of education: Not on file  . Highest education level: 9th grade  Occupational History  . Not on file  Tobacco Use  . Smoking status: Never Smoker  . Smokeless tobacco: Never Used  . Tobacco comment: Hooka  Vaping Use  . Vaping Use: Some days  . Substances: Flavoring  Substance and Sexual Activity  . Alcohol use: Yes    Alcohol/week: 1.0 standard drink    Types: 1 Cans of beer per week    Comment: occassionally  . Drug use: No    Types: Cocaine    Comment: Patient quit 3 months ago. Has taken several times.    NONE SINCE 6 MTHS  . Sexual activity: Yes    Birth control/protection: None  Other Topics Concern  . Not on file  Social History Narrative  . Not on file   Social Determinants of Health   Financial Resource Strain:   . Difficulty of Paying Living Expenses:   Food Insecurity:   . Worried About Charity fundraiser in the Last Year:   . Academic librarian in the Last Year:   Transportation Needs:   . Film/video editor (Medical):   Marland Kitchen Lack of Transportation (Non-Medical):   Physical Activity:   . Days of Exercise per Week:   . Minutes of Exercise per Session:   Stress:   . Feeling of Stress :   Social Connections:   . Frequency of Communication with Friends and Family:   . Frequency of Social Gatherings with Friends and Family:   . Attends Religious Services:   . Active Member of Clubs or Organizations:   . Attends Archivist Meetings:   Marland Kitchen Marital Status:     Family History: Family History  Problem Relation Age of Onset  . Hypertension Maternal Grandmother   . Hypertension Maternal Grandfather     Allergies: No Known Allergies  Medications Prior to Admission  Medication Sig Dispense Refill Last Dose  . Prenatal Vit-Fe Fumarate-FA (PREPLUS) 27-1 MG TABS Take 1 tablet by mouth daily. 30 tablet 13      Review of Systems  All systems reviewed and negative except as stated in HPI  Physical Exam Last menstrual period 10/09/2018, currently breastfeeding. General appearance: alert, oriented, NAD Lungs: normal respiratory effort Heart: regular rate Abdomen: soft, non-tender; gravid, FH appropriate for GA Extremities: No calf swelling or tenderness Presentation: cephalic SVE: 5/90/-2/mid/soft Fetal monitoring: 160/moderate/15x15 accels/no decels Uterine activity: Irregular    Prenatal labs: ABO, Rh:  A+ Antibody:  Negative Rubella:  Immune RPR:   Nonreactive HBsAg:   Nonreactive HIV:   Nonreactive GC/Chlamydia: Negative GBS:   Negative GTT: Failed 1 hour, passed 2 hour Genetic screening:  CF screen negative; quad screen MSAFP negative Anatomy US: [redacted]w[redacted]d normal anatomy, anterior placenta, EFW 335 g, 62.7%ile  Prenatal Transfer Tool  Maternal Diabetes: No Genetic Screening: Normal Maternal Ultrasounds/Referrals: Normal Fetal Ultrasounds or other Referrals:  None Maternal Substance Abuse:  Not  currently, but history of cocaine use in 2016 Significant Maternal Medications:  None Significant Maternal Lab Results: Group B Strep negative and Other: ASC-US/+HPV pap 06/26/2018, needs repeat pap postpartum  No results found for this or any previous visit (from the past 24 hour(s)).  Patient Active Problem List   Diagnosis Date Noted  . Encounter for induction of labor 07/29/2019  . Language barrier affecting health care 12/05/2018  . Intrauterine pregnancy 12/05/2018  . Post term pregnancy at [redacted] weeks gestation 08/21/2016    Assessment: Norma Hardin is a 24 y.o. [phone] at [redacted]w[redacted]d here for elective induction at term.  #Labor: Modified Bishop score 11.  Start pitocin. Consider AROM as appropriate. #Pain: Per patient request #FWB: Cat I, expect SVD #ID:  GBS negative #MOF: Both #MOC:Nexplanon (at [facility]) #Circ:  Unsure  Gypsy Decant, MD PGY-2 Resident Family Medicine 07/29/2019, 6:09 PM  GME ATTESTATION:  I saw and evaluated the patient. I agree with the findings and the plan of care as documented in the resident's note.  Marlowe Alt, DO OB Fellow, Faculty [facility], Center for [facility] 07/29/2019 6:55 PM

## 2019-07-30 ENCOUNTER — Encounter (HOSPITAL_COMMUNITY): Payer: Self-pay | Admitting: Obstetrics & Gynecology

## 2019-07-30 DIAGNOSIS — Z3A40 40 weeks gestation of pregnancy: Secondary | ICD-10-CM

## 2019-07-30 DIAGNOSIS — U071 COVID-19: Secondary | ICD-10-CM

## 2019-07-30 DIAGNOSIS — O9853 Other viral diseases complicating the puerperium: Secondary | ICD-10-CM

## 2019-07-30 LAB — RPR: RPR Ser Ql: NONREACTIVE

## 2019-07-30 MED ORDER — PRENATAL MULTIVITAMIN CH
1.0000 | ORAL_TABLET | Freq: Every day | ORAL | Status: DC
  Administered 2019-07-30 – 2019-07-31 (×2): 1 via ORAL
  Filled 2019-07-30 (×2): qty 1

## 2019-07-30 MED ORDER — ONDANSETRON HCL 4 MG/2ML IJ SOLN: 4.0000 mg | INTRAMUSCULAR | Status: DC | PRN

## 2019-07-30 MED ORDER — WITCH HAZEL-GLYCERIN EX PADS: 1.0000 "application " | MEDICATED_PAD | CUTANEOUS | Status: DC | PRN

## 2019-07-30 MED ORDER — SIMETHICONE 80 MG PO CHEW: 80.0000 mg | CHEWABLE_TABLET | ORAL | Status: DC | PRN

## 2019-07-30 MED ORDER — IBUPROFEN 600 MG PO TABS
600.0000 mg | ORAL_TABLET | Freq: Three times a day (TID) | ORAL | Status: DC | PRN
  Administered 2019-07-30 – 2019-07-31 (×4): 600 mg via ORAL
  Filled 2019-07-30 (×4): qty 1

## 2019-07-30 MED ORDER — DIPHENHYDRAMINE HCL 25 MG PO CAPS: 25.0000 mg | ORAL_CAPSULE | Freq: Four times a day (QID) | ORAL | Status: DC | PRN

## 2019-07-30 MED ORDER — TETANUS-DIPHTH-ACELL PERTUSSIS 5-2.5-18.5 LF-MCG/0.5 IM SUSP: 0.5000 mL | Freq: Once | INTRAMUSCULAR | Status: DC

## 2019-07-30 MED ORDER — SENNOSIDES-DOCUSATE SODIUM 8.6-50 MG PO TABS
2.0000 | ORAL_TABLET | ORAL | Status: DC
  Administered 2019-07-30: 2 via ORAL
  Filled 2019-07-30: qty 2

## 2019-07-30 MED ORDER — ONDANSETRON HCL 4 MG PO TABS: 4.0000 mg | ORAL_TABLET | ORAL | Status: DC | PRN

## 2019-07-30 MED ORDER — COCONUT OIL OIL: 1.0000 "application " | TOPICAL_OIL | Status: DC | PRN

## 2019-07-30 MED ORDER — ACETAMINOPHEN 325 MG PO TABS
650.0000 mg | ORAL_TABLET | Freq: Four times a day (QID) | ORAL | Status: DC | PRN
  Filled 2019-07-30: qty 2

## 2019-07-30 MED ORDER — BENZOCAINE-MENTHOL 20-0.5 % EX AERO: 1.0000 "application " | INHALATION_SPRAY | CUTANEOUS | Status: DC | PRN

## 2019-07-30 MED ORDER — DIBUCAINE (PERIANAL) 1 % EX OINT: 1.0000 "application " | TOPICAL_OINTMENT | CUTANEOUS | Status: DC | PRN

## 2019-07-30 MED ORDER — MEASLES, MUMPS & RUBELLA VAC IJ SOLR: 0.5000 mL | Freq: Once | INTRAMUSCULAR | Status: DC

## 2019-07-30 NOTE — Progress Notes (Signed)
Post Partum Day 1 Subjective: Patient reports feeling well. She is tolerating PO. Ambulating and urinating without difficulty. Lochia minimal.  Objective: Blood pressure 117/78, pulse 76, temperature 98.2 F (36.8 C), temperature source Oral, resp. rate 18, height 5\' 1"  (1.549 m), weight 73.9 kg, last menstrual period 10/09/2018, SpO2 100 %, unknown if currently breastfeeding.  Physical Exam:  General: alert, cooperative, appears stated age and no distress Lochia: appropriate Uterine Fundus: firm Incision: NA DVT Evaluation: No evidence of DVT seen on physical exam.  Recent Labs    07/29/19 1807  HGB 12.7  HCT 37.3    Assessment/Plan: Plan for discharge tomorrow  Breast and bottle feeding Nexplanon at HD due to no insurance Vitals stable COVID positive: Asymptomatic; cont to monitor    LOS: 1 day   07/31/19 07/30/2019, 6:52 AM

## 2019-07-30 NOTE — Lactation Note (Addendum)
This note was copied from a baby's chart. Lactation Consultation Note  Patient Name: Norma Hardin Today's Date: 07/30/2019 Reason for consult: Initial assessment;1st time breastfeeding;Term P2, 7 hour term female infant. Mom is COVID+, mom last use Cocaine and ETOH 3 months ago. Mom's feeding choice is breast and formula feeding at admission. Per mom, infant 1st time was formula in L&D, 2nd made attempt to feed infant, 3rd time infant latched for 10 minutes, infant had Gerber Gentle with iron twice since birth. Infant had one void and one stool since birth. Interpreter used mom doesn't speak English. Per mom, she BF her 23 year old son for two years. She is active on the [facility] program in [place]. She plans to stay home with infant not going to work nor school. Mom latched infant on her left breast using the football hold position, infant latched without difficulty and sustained latch, swallows observed, per mom she only feels a tug with latch but not pain, infant was still breastfeeding after 5 minutes when LC left the room. Mom knows to call RN or LC if she need any assistance with latching infant at breast. Mom knows to breastfeed infant according to hunger cues, on demand, 8 to 12+ times within 24 hours and not exceed 3 hours without BF infant. Reviewed Baby & Me book's Breastfeeding Basics.  Mom made aware of O/P services, breastfeeding support groups, community resources, and our phone # for post-discharge questions.   Maternal Data Formula Feeding for Exclusion: Yes Reason for exclusion: Mother's choice to formula and breast feed on admission Has patient been taught Hand Expression?: Yes Does the patient have breastfeeding experience prior to this delivery?: Yes  Feeding Feeding Type: Breast Fed  LATCH Score Latch: Grasps breast easily, tongue down, lips flanged, rhythmical sucking.  Audible Swallowing: Spontaneous and intermittent  Type of Nipple:  Everted at rest and after stimulation  Comfort (Breast/Nipple): Soft / non-tender  Hold (Positioning): Assistance needed to correctly position infant at breast and maintain latch.  LATCH Score: 9  Interventions Interventions: Breast feeding basics reviewed;Breast compression;Assisted with latch;Adjust position;Skin to skin;Support pillows;Breast massage;Position options;Hand express;Expressed milk  Lactation Tools Discussed/Used WIC Program: Yes   Consult Status Consult Status: Follow-up Date: 07/30/19 Follow-up type: In-patient    Danelle Earthly 07/30/2019, 5:18 AM

## 2019-07-30 NOTE — Progress Notes (Signed)
Ordered food for pt, including breakfast for next day, by Orlan Leavens Spanish Interpretation.

## 2019-07-30 NOTE — Clinical Social Work Maternal (Signed)
CLINICAL SOCIAL WORK MATERNAL/CHILD NOTE  Patient Details  Name: Norma Hardin MRN: [id number] Date of Birth: 03/30/1995  Date:  07/30/2019  Clinical Social Worker Initiating Note:  Durward Fortes, LCSW Date/Time: Initiated:  07/30/19/1020     Child's Name:  Norma Hardin   Biological Parents:  Mother, Father Geraldo Pitter Lidieth [place], [place] Foil)   Need for Interpreter:  Spanish   Reason for Referral:  Other (Comment) (hx of Cocaine use in 2016 but none reported by [facility] since.)   Address:  [place] [address]    Phone number:  [phone] (home)     Additional phone number: none reported.   Household Members/Support Persons (HM/SP):       HM/SP Name Relationship DOB or Age  HM/SP -1        HM/SP -2        HM/SP -3        HM/SP -4        HM/SP -5        HM/SP -6        HM/SP -7        HM/SP -8          Natural Supports (not living in the home):      Professional Supports: None   Employment: Unemployed   Type of Work: none reported.   Education:  9 to 11 years   Homebound arranged: No  Financial Resources:  Self-Pay    Other Resources:  Physicist, medical , [facility]   Cultural/Religious Considerations Which May Impact Care:  none reported to CSW.   Strengths:  Ability to meet basic needs , Compliance with medical plan , Home prepared for child    Psychotropic Medications:       None reported to CSW.   Pediatrician:     not yet chosen   Pediatrician List:   [facility]      Pediatrician Fax Number:    Risk Factors/Current Problems:  None   Cognitive State:  Insightful , Able to Concentrate , Alert    Mood/Affect:  Relaxed , Comfortable , Calm    CSW Assessment: CSW consulted as [name] has a hx of Cocaine use as well as ETOH. CSW unable to determine if substance use was during this pregnancy or all  from 2016 as records in both [name]'s chart and infants chart report different thins. CSW called into [name]'s room to speak with her to attempt to clarify information.    CSW used Personnel officer to speak with [name] via phone. CSW congratulated [name] on the birth of infant and advised [name] of CSW's role. CSW also updated [name] on the reason for CSW calling into her room. [name] reported to this CSW that she didn't engage in any substance use during this pregnancy. [name] asked about ETOH use and [name] reported that she used ETOH prior to her pregnancy but reported no use ETOH or Cocaine use during this pregnancy.  CSW understanding of this.   CSW inquired from [facility] On her mental health hx. [name] reported no  previous mental health hx and reported no SI, HI or DV at this time. [name] reported that her spouse Philippa Chester id her primary support. [name] reported that she and FOB have all needed items to care for infant at this time. CSW provided [name] with PPD  and SIDS education. [name] reported that she understood and had no further questions. [name] reports that infant will sleep in crib once arrived home. [name] reported no other needs to this CSW.  CSW will continue to monitor infants CDS and make CPS report If warranted.    CSW Plan/Description:  No Further Intervention Required/No Barriers to Discharge, Perinatal Mood and Anxiety Disorder (PMADs) Education, Sudden Infant Death Syndrome (SIDS) Education, CSW Will Continue to Monitor Umbilical Cord Tissue Drug Screen Results and Make Report if Celso Sickle, LCSWA 07/30/2019, 10:46 AM

## 2019-07-30 NOTE — Progress Notes (Signed)
Jerilynn Birkenhead MD on unit and asked for update. I informed her pt has been doing fine, Vital signs stable, and that she has been firm. She says pt wants her IV out and that she is okay with her IV coming out. I will pass this along to dayshift.

## 2019-07-31 MED ORDER — ACETAMINOPHEN 325 MG PO TABS: 650.0000 mg | ORAL_TABLET | Freq: Four times a day (QID) | ORAL | 0 refills | Status: DC | PRN

## 2019-07-31 MED ORDER — IBUPROFEN 600 MG PO TABS: 600.0000 mg | ORAL_TABLET | Freq: Three times a day (TID) | ORAL | 0 refills | Status: DC | PRN

## 2019-07-31 MED ORDER — SENNOSIDES-DOCUSATE SODIUM 8.6-50 MG PO TABS: 2.0000 | ORAL_TABLET | ORAL | 0 refills | Status: DC

## 2019-07-31 NOTE — Lactation Note (Signed)
This note was copied from a baby's chart. Lactation Consultation Note  Patient Name: Norma Hardin [name] Rosa [id number] Date: 07/31/2019 Reason for consult: Follow-up assessment;Mother's request;Term P2, 32 hour term female infant -5.5% weight loss. Interpreter used  Per mom, she has been alternating infant with feedings between breastfeeding and  formula. Mom only latched infant twice last night due infant quickly falling asleep while breastfeeding, infant only breastfeeding at 5 or 8 minute intervals. LC notice mom has been attempting to breastfeed infant swaddle in two blankets and having clothing on as well. LC discussed breastfeeding infant STS not in clothing and blankets and to do waking techniques while feeding infant. LC discussed with mom to tickle neck and cheek, talk to infant and take infant off breast and burp infant and re-latch him to breast to keep infant awake. Mom goal is to increase duration to 15 minutes or longer while breastfeeding infant  Mom will latch infant for every feeding and then afterwards supplement with formula to help stabilize weight.   Mom knows to call RN or LC if she has further questions, concerns or need assistance with latching infant at breast.   Maternal Data    Feeding Feeding Type: Breast Fed  LATCH Score Latch: Grasps breast easily, tongue down, lips flanged, rhythmical sucking.  Audible Swallowing: Spontaneous and intermittent  Type of Nipple: Everted at rest and after stimulation  Comfort (Breast/Nipple): Soft / non-tender  Hold (Positioning): Assistance needed to correctly position infant at breast and maintain latch.  LATCH Score: 9  Interventions Interventions: Assisted with latch;Skin to skin;Support pillows;Adjust position;Breast compression;Position options;Hand express;Expressed milk  Lactation Tools Discussed/Used     Consult Status Consult Status: Follow-up Date: 07/31/19 Follow-up type:  In-patient    Danelle Earthly 07/31/2019, 5:53 AM

## 2019-09-12 ENCOUNTER — Telehealth: Payer: Self-pay

## 2019-09-12 NOTE — Telephone Encounter (Signed)
Telephoned patient at mobile number. Left voice message with [facility] contact information. 

## 2019-09-13 ENCOUNTER — Other Ambulatory Visit: Payer: Self-pay

## 2019-10-03 ENCOUNTER — Ambulatory Visit
Admission: RE | Admit: 2019-10-03 | Discharge: 2019-10-03 | Disposition: A | Discharge status: Home / Self Care | Payer: No Typology Code available for payment source | Source: Ambulatory Visit | Attending: Obstetrics and Gynecology | Admitting: Obstetrics and Gynecology

## 2019-10-03 ENCOUNTER — Other Ambulatory Visit: Payer: Self-pay | Admitting: Obstetrics and Gynecology

## 2019-10-03 ENCOUNTER — Other Ambulatory Visit: Payer: Self-pay

## 2019-10-03 ENCOUNTER — Ambulatory Visit: Payer: Self-pay | Admitting: *Deleted

## 2019-10-03 VITALS — BP 100/68 | Temp 97.3°F | Wt 137.5 lb

## 2019-10-03 DIAGNOSIS — N6320 Unspecified lump in the left breast, unspecified quadrant: Secondary | ICD-10-CM

## 2019-10-03 DIAGNOSIS — Z1239 Encounter for other screening for malignant neoplasm of breast: Secondary | ICD-10-CM

## 2019-10-03 NOTE — Patient Instructions (Addendum)
Informed Norma Hardin about breast self awareness. Patient did not need a Pap smear today due to last Pap smear was on 09/03/2019. Let her know [facility] will cover Pap smears and HPV typing every 5 years unless has a history of abnormal Pap smears. Referred patient to the [facility] for a breast ultrasound per recommendation. Appointment scheduled for October 03, 2019 at 2:10pm. Patient aware of appointment and will be there. Norma Hardin verbalized understanding.  [name], RN, FNP student 2:14 PM

## 2019-10-03 NOTE — Progress Notes (Addendum)
Ms. Chelsia Serres Congo Rosa is a 24 y.o. female who presents to [facility] clinic today with complaints of a left breast lump with nipple discharge.    Pap Smear: Pap smear not completed today. Last Pap smear was 09/03/2019 at [facility]) clinic and was normal with positive HPV. Per patient has history of an abnormal Pap smear that she was to get a colposcopy for but patient missed her appointment twice. Last Pap smear result is not available in Epic but records have been obtained from [facility].   Physical exam: Breasts Breasts asymmetrical, R > L which was noticed at her last [facility] appointment. Patient states she hasn't noticed this. No skin abnormalities bilateral breasts. No nipple retraction bilateral breasts. Patient complained of nipple discharge in bilateral breasts and when discharge was expressed, it has the appearance of breast milk. Patient recently gave birth in June and states that the "discharge only occurs when she expressed it". Patient encouraged to stop expressing if she no longer wished to breast feed. No lymphadenopathy. No lumps palpated bilateral breasts.       Pelvic/Bimanual Pap is not indicated today.    Smoking History: Patient has never smoked.    Patient Navigation: Patient education provided. Access to services provided for patient through [facility] program. Natale Lay, Spanish interpreter provided through [facility].    Breast and Cervical Cancer Risk Assessment: Patient does not have family history of breast cancer, known genetic mutations, or radiation treatment to the chest before age 28. Patient does not have history of cervical dysplasia, immunocompromised, or DES exposure in-utero.  Risk Assessment    Risk Scores      10/03/2019 06/26/2018   Last edited by: Narda Rutherford, LPN Stoney Bang H, LPN   5-year risk:     Lifetime risk:           Dondra Spry risk assessment was completed but no score was obtained due to patient being  under age 34.  A: [facility] exam without pap smear Complaints of a left breast lump with nipple discharge.  P: Referred patient to the [facility] for a breast ultrasound per recommendation. Appointment scheduled October 03, 2019 at 2:10pm.  [name], RN, FNP student 10/03/2019 2:03 PM   Attestation of Supervision of Student:  I confirm that I have verified the information documented in the nurse practitioner student's note and that I have also personally reperformed the history, physical exam and all medical decision making activities.  I have verified that all services and findings are accurately documented in this student's note; and I agree with management and plan as outlined in the documentation. I have also made any necessary editorial changes.  Brannock, Kathaleen Maser, RN Center for [employer], [facility] 10/03/2019 2:36 PM

## 2019-10-16 ENCOUNTER — Other Ambulatory Visit: Payer: Self-pay

## 2019-10-16 ENCOUNTER — Ambulatory Visit
Admission: RE | Admit: 2019-10-16 | Discharge: 2019-10-16 | Disposition: A | Discharge status: Home / Self Care | Payer: Self-pay | Source: Ambulatory Visit | Attending: Obstetrics and Gynecology | Admitting: Obstetrics and Gynecology

## 2019-10-16 DIAGNOSIS — N6320 Unspecified lump in the left breast, unspecified quadrant: Secondary | ICD-10-CM

## 2023-04-04 ENCOUNTER — Encounter: Payer: Self-pay | Admitting: Obstetrics & Gynecology

## 2023-05-09 ENCOUNTER — Other Ambulatory Visit (HOSPITAL_COMMUNITY)
Admission: RE | Admit: 2023-05-09 | Discharge: 2023-05-09 | Disposition: A | Discharge status: Home / Self Care | Payer: Self-pay | Source: Ambulatory Visit | Attending: Obstetrics and Gynecology | Admitting: Obstetrics and Gynecology

## 2023-05-09 ENCOUNTER — Ambulatory Visit: Payer: Self-pay | Admitting: Hematology and Oncology

## 2023-05-09 DIAGNOSIS — B977 Papillomavirus as the cause of diseases classified elsewhere: Secondary | ICD-10-CM

## 2023-05-09 DIAGNOSIS — Z01812 Encounter for preprocedural laboratory examination: Secondary | ICD-10-CM

## 2023-05-09 NOTE — Progress Notes (Signed)
 Ms. Norma Hardin is a 28 y.o. female who presents to [facility] clinic today with no complaints.    Pap Smear: Pap not smear completed today. Last Pap smear was 03/20/2023 and was normal. Per patient has history of an abnormal Pap smear. Last Pap smear result is available in Epic. 08/20/2019 - normal/ HPV+; 06/26/2018 - normal/ HPV+   Physical exam: Breasts Breasts symmetrical. No skin abnormalities bilateral breasts. No nipple retraction bilateral breasts. No nipple discharge bilateral breasts. No lymphadenopathy. No lumps palpated bilateral breasts.       Pelvic/Bimanual Pap is not indicated today   GYNECOLOGY CLINIC COLPOSCOPY PROCEDURE NOTE  Ms. Norma Hardin is a 29 y.o. [id number] here for colposcopy for  HPV  pap smear on 03/20/2023. Discussed role for HPV in cervical dysplasia, need for surveillance.  Patient given informed consent, signed copy in the chart, time out was performed.  Placed in lithotomy position. Cervix viewed with speculum and colposcope after application of acetic acid.   Colposcopy adequate? Yes  no visible lesions.  ECC specimen obtained. All specimens were labelled and sent to pathology.  Patient was given post procedure instructions.  Will follow up pathology and manage accordingly.  Routine preventative health maintenance measures emphasized.    Smoking History: Patient has never smoked and was not referred to quit line.    Patient Navigation: Patient education provided. Access to services provided for patient through [facility] program. Natale Lay interpreter provided. No transportation provided   Colorectal Cancer Screening: Per patient has never had colonoscopy completed No complaints today.    Breast and Cervical Cancer Risk Assessment: Patient does not have family history of breast cancer, known genetic mutations, or radiation treatment to the chest before age 68. Patient does not have history of cervical dysplasia,  immunocompromised, or DES exposure in-utero.  Risk Assessment   No risk assessment data for the current encounter  Risk Scores       10/03/2019   Last edited by: Narda Rutherford, LPN   5-year risk:    Lifetime risk:             A: [facility] exam without pap smear No complaints with benign exam. Colposcopy as above.   P: Will update patient with results once pathology obtained. If normal or low grade, will repeat Pap in one year. If high grade, will refer to gynecology for evaluation and treatment.   Pascal Lux, NP 05/09/2023 1:34 PM

## 2023-05-11 LAB — SURGICAL PATHOLOGY

## 2023-06-06 ENCOUNTER — Telehealth: Payer: Self-pay

## 2023-06-06 NOTE — Telephone Encounter (Signed)
 Using [facility] Interpreter, Herma Longest, I have spoken with the pt and advised of her results. Pt was also advised to repeat her PAP in one year. Pt expressed understanding of this information.

## 2023-10-04 ENCOUNTER — Ambulatory Visit (INDEPENDENT_AMBULATORY_CARE_PROVIDER_SITE_OTHER): Payer: Self-pay | Admitting: Student in an Organized Health Care Education/Training Program

## 2023-10-04 ENCOUNTER — Encounter: Payer: Self-pay | Admitting: Student in an Organized Health Care Education/Training Program

## 2023-10-04 VITALS — BP 110/84 | HR 77 | Ht 62.3 in | Wt 162.0 lb

## 2023-10-04 DIAGNOSIS — E663 Overweight: Secondary | ICD-10-CM

## 2023-10-04 DIAGNOSIS — Z01818 Encounter for other preprocedural examination: Secondary | ICD-10-CM

## 2023-10-04 DIAGNOSIS — Z0000 Encounter for general adult medical examination without abnormal findings: Secondary | ICD-10-CM | POA: Insufficient documentation

## 2023-10-04 DIAGNOSIS — R0789 Other chest pain: Secondary | ICD-10-CM

## 2023-10-04 LAB — LIPID PANEL
Cholesterol: 188 mg/dL (ref 0–200)
HDL: 60.8 mg/dL (ref >39.00)
LDL Cholesterol: 100 mg/dL — ABNORMAL HIGH (ref 0–99)
NonHDL: 126.94
Total CHOL/HDL Ratio: 3
Triglycerides: 133 mg/dL (ref 0.0–149.0)
VLDL: 26.6 mg/dL (ref 0.0–40.0)

## 2023-10-04 LAB — CBC
HCT: 42.1 % (ref 36.0–46.0)
Hemoglobin: 13.9 g/dL (ref 12.0–15.0)
MCHC: 33.1 g/dL (ref 30.0–36.0)
MCV: 98.5 fl (ref 78.0–100.0)
Platelets: 398 K/uL (ref 150.0–400.0)
RBC: 4.27 Mil/uL (ref 3.87–5.11)
RDW: 12.9 % (ref 11.5–15.5)
WBC: 9.6 K/uL (ref 4.0–10.5)

## 2023-10-04 LAB — COMPREHENSIVE METABOLIC PANEL WITH GFR
ALT: 20 U/L (ref 0–35)
AST: 20 U/L (ref 0–37)
Albumin: 4.6 g/dL (ref 3.5–5.2)
Alkaline Phosphatase: 61 U/L (ref 39–117)
BUN: 10 mg/dL (ref 6–23)
CO2: 27 meq/L (ref 19–32)
Calcium: 10 mg/dL (ref 8.4–10.5)
Chloride: 102 meq/L (ref 96–112)
Creatinine, Ser: 0.6 mg/dL (ref 0.40–1.20)
GFR: 123.64 mL/min (ref >60.00)
Glucose, Bld: 92 mg/dL (ref 70–99)
Potassium: 4 meq/L (ref 3.5–5.1)
Sodium: 136 meq/L (ref 135–145)
Total Bilirubin: 0.6 mg/dL (ref 0.2–1.2)
Total Protein: 8.1 g/dL (ref 6.0–8.3)

## 2023-10-04 LAB — PROTIME-INR
INR: 1.1 ratio — ABNORMAL HIGH (ref 0.8–1.0)
Prothrombin Time: 12.1 s (ref 9.6–13.1)

## 2023-10-04 LAB — APTT: aPTT: 30.9 s (ref 25.4–36.8)

## 2023-10-04 LAB — HEMOGLOBIN A1C: Hgb A1c MFr Bld: 5.2 % (ref 4.6–6.5)

## 2023-10-04 NOTE — Patient Instructions (Signed)
  VISIT SUMMARY: You came in today for a pre-operative evaluation for your upcoming BBL and J plasma surgery. We discussed your occasional chest discomfort and vision issues.  YOUR PLAN: -INTERMITTENT CHEST DISCOMFORT: Intermittent chest discomfort means you sometimes feel an internal sensation in your chest. To ensure your heart is functioning well, we will order an EKG, which is a test that records the electrical activity of your heart.  -DECREASED DISTANCE VISION (MYOPIA): Myopia, or nearsightedness, means you have difficulty seeing things that are far away. We recommend you see an optometrist for a vision evaluation and possibly get corrective lenses.

## 2023-10-04 NOTE — Assessment & Plan Note (Addendum)
 Patient scheduled for liposuction and a skin tightening procedure for October with plastic surgery.  She has had a similar procedure about 2 years ago and tolerated that well.  She has no medical issues that are active right now.  She takes no medications.  She does not exercise regularly but otherwise has a normal functional status.  Does not work.  She has over 4 metabolic equivalents with her daily activities including climbing stairs and taking care of her young children.  This is a low risk superficial procedure.  Will get lab work today.  EKG was reassuring with no underlying cardiac issues.

## 2023-10-04 NOTE — Assessment & Plan Note (Signed)
 Intermittent left-sided chest discomfort is atypical in nature.  Only occurring at rest.  Associated with stress.  She has no cardiac risk factors.  EKG today is reassuring.  Will check A1c and lipids for further restratification.  But I think this is very low risk to be cardiac in nature.  I recommended increasing exercise, lifestyle modification, and stress reduction.  I asked her to come and see me if she is having further chest discomfort with exercise or other exertion.

## 2023-10-04 NOTE — Progress Notes (Signed)
 New Patient Office Visit  Subjective    Patient ID: Norma Hardin, female    DOB: 06-23-1995  Age: 28 y.o. MRN: [id number]  CC:  Chief Complaint  Patient presents with   Establish Care    Patient would like pre-op clearance     HPI  Discussed the use of AI scribe software for clinical note transcription with the patient, who gave verbal consent to proceed.  History of Present Illness Norma Hardin is a 28 year old female who presents for pre-operative evaluation for a BBL and J plasma surgery.  She is scheduled for a BBL and J plasma surgery on October 21st, which involves lipotransfer from the stomach to the hips. She previously underwent a similar procedure two years ago without J plasma, experiencing initial weakness possibly due to blood loss, but otherwise had a successful recovery.  She experiences occasional chest discomfort, described as an internal sensation in the chest area, occurring approximately three times a week. This discomfort is not related to physical exertion and can occur at rest. The sensation began after her stepfather's passing. She denies shortness of breath and reports that the chest discomfort is not present at the time of the visit.  She has no known medical problems and is not on any medications except for vitamins. She has no history of hospitalizations or surgeries other than the one two years ago. She received routine vaccinations as a child in [place].  She has been living in [place] for ten years, previously lived in [place]  for four months, and is originally from [place]. She lives with her partner, mother, two sisters, and two sons aged four and seven. She is currently not working and does not engage in regular exercise, attributing it to 'laziness'. She has been attempting to lose weight for the past two months by avoiding junk food and eating healthier, but does not count calories.  She reports  difficulty seeing from afar but has never had an eye examination or glasses. No issues with hearing, skin problems, or other systemic symptoms.    Outpatient Encounter Medications as of 10/04/2023  Medication Sig   Multiple Vitamin (MULTIVITAMIN PO) Take by mouth.   [DISCONTINUED] acetaminophen  (TYLENOL ) 325 MG tablet Take 2 tablets (650 mg total) by mouth every 6 (six) hours as needed (for pain scale < 4).   [DISCONTINUED] B Complex Vitamins (B COMPLEX 1 PO) Take by mouth.   [DISCONTINUED] BIOTIN PO Take by mouth.   [DISCONTINUED] ibuprofen  (ADVIL ) 600 MG tablet Take 1 tablet (600 mg total) by mouth every 8 (eight) hours as needed for mild pain.   No facility-administered encounter medications on file as of 10/04/2023.    Past Medical History:  Diagnosis Date   Depression    Drug abuse (HCC)    Cocaine, ETOH   Medical history non-contributory     Past Surgical History:  Procedure Laterality Date   NO PAST SURGERIES      Family History  Problem Relation Age of Onset   Hypertension Maternal Grandmother    Hypertension Maternal Grandfather          Objective    BP 110/84   Pulse 77   Ht 5' 2.3 (1.582 m)   Wt 162 lb (73.5 kg)   BMI 29.35 kg/m   Physical Exam  Gen: Well-appearing woman Eyes: Normal Ears: Normal tympanic membranes bilaterally Heart: Regular, no murmur Lungs: Unlabored, clear throughout Abd: Soft, overweight with striae present, no organomegaly or tenderness  Ext: Warm, no edema, normal joints. Neuro: Alert, conversational, full strength upper and lower extremities, normal gait and balance Psych: Appropriate mood and affect, not not anxious or depressed appearing  EKG: Obtained due to recent chest discomfort.  EKG is normal sinus rhythm, normal intervals, normal axis, no Q waves, no ST changes or T wave inversions     Assessment & Plan:   Problem List Items Addressed This Visit       Unprioritized   Chest discomfort (Chronic)    Intermittent left-sided chest discomfort is atypical in nature.  Only occurring at rest.  Associated with stress.  She has no cardiac risk factors.  EKG today is reassuring.  Will check A1c and lipids for further restratification.  But I think this is very low risk to be cardiac in nature.  I recommended increasing exercise, lifestyle modification, and stress reduction.  I asked her to come and see me if she is having further chest discomfort with exercise or other exertion.      Relevant Orders   EKG 12-Lead   Hemoglobin A1c   Lipid panel   Overweight (BMI 25.0-29.9) (Chronic)   Weight today 162 pounds with a BMI of 29.  We talked about lifestyle modifications including nutrition and increasing exercise.  Will check an A1c and lipids today.      Relevant Orders   Hemoglobin A1c   Lipid panel   Preoperative general physical examination - Primary   Patient scheduled for liposuction and a skin tightening procedure for October with plastic surgery.  She has had a similar procedure about 2 years ago and tolerated that well.  She has no medical issues that are active right now.  She takes no medications.  She does not exercise regularly but otherwise has a normal functional status.  Does not work.  She has over 4 metabolic equivalents with her daily activities including climbing stairs and taking care of her young children.  This is a low risk superficial procedure.  Will get lab work today.  EKG was reassuring with no underlying cardiac issues.      Relevant Orders   CBC   Comprehensive metabolic panel with GFR   Protime-INR    Return in about 1 year (around 10/03/2024).   Cleatus Debby Specking, MD

## 2023-10-04 NOTE — Assessment & Plan Note (Signed)
 Weight today 162 pounds with a BMI of 29.  We talked about lifestyle modifications including nutrition and increasing exercise.  Will check an A1c and lipids today.

## 2023-10-05 ENCOUNTER — Ambulatory Visit: Payer: Self-pay | Admitting: Student in an Organized Health Care Education/Training Program

## 2023-10-05 NOTE — Telephone Encounter (Signed)
 Copied from CRM #[id number]. Topic: Clinical - Lab/Test Results >> Oct 05, 2023  2:05 PM Rosina BIRCH wrote: Reason for CRM: patient called stating the office has not received the blood test from pre service they only received the EKG CB [phone]

## 2023-10-05 NOTE — Telephone Encounter (Signed)
 Labs have been faxed.

## 2023-10-06 ENCOUNTER — Encounter: Payer: Self-pay | Admitting: Student in an Organized Health Care Education/Training Program

## 2023-10-06 NOTE — Telephone Encounter (Signed)
 I tried to call the patient but got no answer.  I have written the letter.  Please ask her to tell me if she needs anything further.

## 2023-10-06 NOTE — Telephone Encounter (Signed)
 Copied from CRM #[id number]. Topic: Clinical - Medication Question >> Oct 05, 2023  5:28 PM Shereese L wrote: Reason for CRM: patient called in and stated that she needs a letter stating that she never done drugs or she's not depressed. She stated that the letter is for a clinic([facility]) where she's going to have surgery done. She would like the doctor or nurse to give her a call as well. She stated she can't get the surgery done without the letter

## 2023-10-06 NOTE — Telephone Encounter (Signed)
 Surgery center requesting letter stating patient is not and has not been a current drug user and stating she is not depressed as well. Please advise

## 2024-02-21 ENCOUNTER — Telehealth: Payer: Self-pay

## 2024-02-21 NOTE — Telephone Encounter (Signed)
 Using [address], [place], [place]: [id number], I have have left this pt a detailed message inviting her to have her cervical screening (PAP) done on April 18, 2024. I requested pt call back to [facility] at [phone] to schedule this appt.

## 2024-10-04 ENCOUNTER — Encounter: Admitting: Student in an Organized Health Care Education/Training Program
